# Patient Record
Sex: Female | Born: 1960
Health system: Southern US, Community
[De-identification: ages and names within clinical notes are randomized; demographics above are authoritative.]

---

## 1997-05-19 ENCOUNTER — Other Ambulatory Visit: Admission: RE | Admit: 1997-05-19 | Discharge: 1997-05-19 | Payer: Self-pay | Admitting: Obstetrics and Gynecology

## 1999-10-15 ENCOUNTER — Encounter: Payer: Self-pay | Admitting: Obstetrics and Gynecology

## 1999-10-15 ENCOUNTER — Encounter: Admission: RE | Admit: 1999-10-15 | Discharge: 1999-10-15 | Payer: Self-pay | Admitting: Obstetrics and Gynecology

## 1999-10-15 ENCOUNTER — Other Ambulatory Visit: Admission: RE | Admit: 1999-10-15 | Discharge: 1999-10-15 | Payer: Self-pay | Admitting: Obstetrics and Gynecology

## 2000-10-26 ENCOUNTER — Other Ambulatory Visit: Admission: RE | Admit: 2000-10-26 | Discharge: 2000-10-26 | Payer: Self-pay | Admitting: Obstetrics and Gynecology

## 2001-07-26 ENCOUNTER — Encounter: Admission: RE | Admit: 2001-07-26 | Discharge: 2001-07-26 | Payer: Self-pay | Admitting: Obstetrics and Gynecology

## 2001-07-26 ENCOUNTER — Encounter: Payer: Self-pay | Admitting: Obstetrics and Gynecology

## 2004-05-13 ENCOUNTER — Encounter: Admission: RE | Admit: 2004-05-13 | Discharge: 2004-05-13 | Payer: Self-pay | Admitting: Obstetrics and Gynecology

## 2006-02-16 ENCOUNTER — Encounter: Admission: RE | Admit: 2006-02-16 | Discharge: 2006-02-16 | Payer: Self-pay | Admitting: Obstetrics and Gynecology

## 2007-04-26 ENCOUNTER — Encounter: Admission: RE | Admit: 2007-04-26 | Discharge: 2007-04-26 | Payer: Self-pay | Admitting: Obstetrics and Gynecology

## 2008-07-07 ENCOUNTER — Encounter: Admission: RE | Admit: 2008-07-07 | Discharge: 2008-07-07 | Payer: Self-pay | Admitting: Obstetrics and Gynecology

## 2009-08-03 ENCOUNTER — Encounter: Admission: RE | Admit: 2009-08-03 | Discharge: 2009-08-03 | Payer: Self-pay | Admitting: Obstetrics and Gynecology

## 2010-08-21 ENCOUNTER — Other Ambulatory Visit: Payer: Self-pay | Admitting: Obstetrics and Gynecology

## 2010-08-21 DIAGNOSIS — Z1231 Encounter for screening mammogram for malignant neoplasm of breast: Secondary | ICD-10-CM

## 2010-09-30 ENCOUNTER — Ambulatory Visit
Admission: RE | Admit: 2010-09-30 | Discharge: 2010-09-30 | Disposition: A | Discharge status: Home / Self Care | Payer: BC Managed Care – PPO | Source: Ambulatory Visit | Attending: Obstetrics and Gynecology | Admitting: Obstetrics and Gynecology

## 2010-09-30 DIAGNOSIS — Z1231 Encounter for screening mammogram for malignant neoplasm of breast: Secondary | ICD-10-CM

## 2011-09-30 ENCOUNTER — Other Ambulatory Visit: Payer: Self-pay | Admitting: Obstetrics and Gynecology

## 2011-09-30 DIAGNOSIS — Z1231 Encounter for screening mammogram for malignant neoplasm of breast: Secondary | ICD-10-CM

## 2011-10-17 ENCOUNTER — Ambulatory Visit
Admission: RE | Admit: 2011-10-17 | Discharge: 2011-10-17 | Disposition: A | Discharge status: Home / Self Care | Payer: BC Managed Care – PPO | Source: Ambulatory Visit | Attending: Obstetrics and Gynecology | Admitting: Obstetrics and Gynecology

## 2011-10-17 DIAGNOSIS — Z1231 Encounter for screening mammogram for malignant neoplasm of breast: Secondary | ICD-10-CM

## 2012-12-07 ENCOUNTER — Other Ambulatory Visit: Payer: Self-pay

## 2012-12-07 DIAGNOSIS — Z1231 Encounter for screening mammogram for malignant neoplasm of breast: Secondary | ICD-10-CM

## 2013-01-10 ENCOUNTER — Ambulatory Visit
Admission: RE | Admit: 2013-01-10 | Discharge: 2013-01-10 | Disposition: A | Discharge status: Home / Self Care | Payer: BC Managed Care – PPO | Source: Ambulatory Visit

## 2013-01-10 DIAGNOSIS — Z1231 Encounter for screening mammogram for malignant neoplasm of breast: Secondary | ICD-10-CM

## 2015-04-07 DIAGNOSIS — E119 Type 2 diabetes mellitus without complications: Secondary | ICD-10-CM | POA: Diagnosis not present

## 2015-04-23 DIAGNOSIS — R740 Nonspecific elevation of levels of transaminase and lactic acid dehydrogenase [LDH]: Secondary | ICD-10-CM | POA: Diagnosis not present

## 2015-05-07 DIAGNOSIS — E119 Type 2 diabetes mellitus without complications: Secondary | ICD-10-CM | POA: Diagnosis not present

## 2015-05-08 DIAGNOSIS — L309 Dermatitis, unspecified: Secondary | ICD-10-CM | POA: Diagnosis not present

## 2015-05-08 DIAGNOSIS — E663 Overweight: Secondary | ICD-10-CM | POA: Diagnosis not present

## 2015-05-08 DIAGNOSIS — Z6828 Body mass index (BMI) 28.0-28.9, adult: Secondary | ICD-10-CM | POA: Diagnosis not present

## 2015-05-08 DIAGNOSIS — M317 Microscopic polyangiitis: Secondary | ICD-10-CM | POA: Diagnosis not present

## 2015-05-18 DIAGNOSIS — H5211 Myopia, right eye: Secondary | ICD-10-CM | POA: Diagnosis not present

## 2015-05-22 DIAGNOSIS — M310 Hypersensitivity angiitis: Secondary | ICD-10-CM | POA: Diagnosis not present

## 2015-06-11 DIAGNOSIS — M310 Hypersensitivity angiitis: Secondary | ICD-10-CM | POA: Diagnosis not present

## 2015-06-11 DIAGNOSIS — M7580 Other shoulder lesions, unspecified shoulder: Secondary | ICD-10-CM | POA: Diagnosis not present

## 2015-06-11 DIAGNOSIS — E1165 Type 2 diabetes mellitus with hyperglycemia: Secondary | ICD-10-CM | POA: Diagnosis not present

## 2015-06-11 DIAGNOSIS — R7989 Other specified abnormal findings of blood chemistry: Secondary | ICD-10-CM | POA: Diagnosis not present

## 2015-07-09 DIAGNOSIS — E1165 Type 2 diabetes mellitus with hyperglycemia: Secondary | ICD-10-CM | POA: Diagnosis not present

## 2015-07-09 DIAGNOSIS — R7989 Other specified abnormal findings of blood chemistry: Secondary | ICD-10-CM | POA: Diagnosis not present

## 2015-08-14 DIAGNOSIS — R7989 Other specified abnormal findings of blood chemistry: Secondary | ICD-10-CM | POA: Diagnosis not present

## 2015-08-14 DIAGNOSIS — R945 Abnormal results of liver function studies: Secondary | ICD-10-CM | POA: Diagnosis not present

## 2015-09-13 DIAGNOSIS — Z6828 Body mass index (BMI) 28.0-28.9, adult: Secondary | ICD-10-CM | POA: Diagnosis not present

## 2015-09-13 DIAGNOSIS — K760 Fatty (change of) liver, not elsewhere classified: Secondary | ICD-10-CM | POA: Diagnosis not present

## 2015-09-13 DIAGNOSIS — R040 Epistaxis: Secondary | ICD-10-CM | POA: Diagnosis not present

## 2015-09-17 DIAGNOSIS — R040 Epistaxis: Secondary | ICD-10-CM | POA: Diagnosis not present

## 2015-09-18 DIAGNOSIS — R740 Nonspecific elevation of levels of transaminase and lactic acid dehydrogenase [LDH]: Secondary | ICD-10-CM | POA: Diagnosis not present

## 2015-09-18 DIAGNOSIS — K760 Fatty (change of) liver, not elsewhere classified: Secondary | ICD-10-CM | POA: Diagnosis not present

## 2015-09-20 ENCOUNTER — Other Ambulatory Visit (HOSPITAL_COMMUNITY): Payer: Self-pay | Admitting: Gastroenterology

## 2015-09-20 DIAGNOSIS — R748 Abnormal levels of other serum enzymes: Secondary | ICD-10-CM

## 2015-09-20 DIAGNOSIS — K760 Fatty (change of) liver, not elsewhere classified: Secondary | ICD-10-CM

## 2015-10-02 DIAGNOSIS — R040 Epistaxis: Secondary | ICD-10-CM | POA: Diagnosis not present

## 2015-10-03 ENCOUNTER — Other Ambulatory Visit: Payer: Self-pay | Admitting: Radiology

## 2015-10-04 ENCOUNTER — Other Ambulatory Visit: Payer: Self-pay | Admitting: General Surgery

## 2015-10-05 ENCOUNTER — Ambulatory Visit (HOSPITAL_COMMUNITY)
Admission: RE | Admit: 2015-10-05 | Discharge: 2015-10-05 | Disposition: A | Discharge status: Home / Self Care | Payer: BLUE CROSS/BLUE SHIELD | Source: Ambulatory Visit | Attending: Gastroenterology | Admitting: Gastroenterology

## 2015-10-05 ENCOUNTER — Encounter (HOSPITAL_COMMUNITY): Payer: Self-pay | Admitting: *Deleted

## 2015-10-05 DIAGNOSIS — R7989 Other specified abnormal findings of blood chemistry: Secondary | ICD-10-CM | POA: Diagnosis not present

## 2015-10-05 DIAGNOSIS — R748 Abnormal levels of other serum enzymes: Secondary | ICD-10-CM | POA: Insufficient documentation

## 2015-10-05 DIAGNOSIS — R945 Abnormal results of liver function studies: Secondary | ICD-10-CM | POA: Diagnosis not present

## 2015-10-05 DIAGNOSIS — Z79899 Other long term (current) drug therapy: Secondary | ICD-10-CM | POA: Diagnosis not present

## 2015-10-05 DIAGNOSIS — K7581 Nonalcoholic steatohepatitis (NASH): Secondary | ICD-10-CM | POA: Diagnosis not present

## 2015-10-05 DIAGNOSIS — Z7984 Long term (current) use of oral hypoglycemic drugs: Secondary | ICD-10-CM | POA: Diagnosis not present

## 2015-10-05 LAB — GLUCOSE, CAPILLARY: Glucose-Capillary: 107 mg/dL — ABNORMAL HIGH (ref 65–99)

## 2015-10-05 LAB — CBC
HEMATOCRIT: 41 % (ref 36.0–46.0)
HEMOGLOBIN: 13 g/dL (ref 12.0–15.0)
MCH: 29.3 pg (ref 26.0–34.0)
MCHC: 31.7 g/dL (ref 30.0–36.0)
MCV: 92.6 fL (ref 78.0–100.0)
Platelets: 184 10*3/uL (ref 150–400)
RBC: 4.43 MIL/uL (ref 3.87–5.11)
RDW: 12.9 % (ref 11.5–15.5)
WBC: 6.5 10*3/uL (ref 4.0–10.5)

## 2015-10-05 LAB — PROTIME-INR
INR: 1.01
Prothrombin Time: 13.3 seconds (ref 11.4–15.2)

## 2015-10-05 LAB — APTT: aPTT: 34 seconds (ref 24–36)

## 2015-10-05 MED ORDER — GELATIN ABSORBABLE 12-7 MM EX MISC
CUTANEOUS | Status: AC
  Filled 2015-10-05: qty 1

## 2015-10-05 MED ORDER — LIDOCAINE HCL 1 % IJ SOLN
INTRAMUSCULAR | Status: AC
  Filled 2015-10-05: qty 20

## 2015-10-05 MED ORDER — MIDAZOLAM HCL 2 MG/2ML IJ SOLN
INTRAMUSCULAR | Status: DC | PRN
  Administered 2015-10-05: 1 mg via INTRAVENOUS
  Administered 2015-10-05 (×2): 0.5 mg via INTRAVENOUS

## 2015-10-05 MED ORDER — SODIUM CHLORIDE 0.9 % IV SOLN
INTRAVENOUS | Status: DC | PRN
  Administered 2015-10-05: 30 mL/h via INTRAVENOUS

## 2015-10-05 MED ORDER — FENTANYL CITRATE (PF) 100 MCG/2ML IJ SOLN
INTRAMUSCULAR | Status: AC
  Filled 2015-10-05: qty 4

## 2015-10-05 MED ORDER — SODIUM CHLORIDE 0.9 % IV SOLN: INTRAVENOUS | Status: DC

## 2015-10-05 MED ORDER — MIDAZOLAM HCL 2 MG/2ML IJ SOLN
INTRAMUSCULAR | Status: AC
  Filled 2015-10-05: qty 4

## 2015-10-05 MED ORDER — FENTANYL CITRATE (PF) 100 MCG/2ML IJ SOLN
INTRAMUSCULAR | Status: DC | PRN
  Administered 2015-10-05: 25 ug via INTRAVENOUS
  Administered 2015-10-05: 50 ug via INTRAVENOUS
  Administered 2015-10-05: 25 ug via INTRAVENOUS

## 2015-10-05 MED ORDER — HYDROCODONE-ACETAMINOPHEN 5-325 MG PO TABS: 1.0000 | ORAL_TABLET | ORAL | Status: DC | PRN

## 2015-10-05 NOTE — Discharge Instructions (Signed)
Liver Biopsy, Care After These instructions give you information on caring for yourself after your procedure. Your doctor may also give you more specific instructions. Call your doctor if you have any problems or questions after your procedure. HOME CARE  Rest at home for 1-2 days or as told by your doctor.  Have someone stay with you for at least 24 hours.  Do not do these things in the first 24 hours:  Drive.  Use machinery.  Take care of other people.  Sign legal documents.  Take a bath or shower.  There are many different ways to close and cover a cut (incision). For example, a cut can be closed with stitches, skin glue, or adhesive strips. Follow your doctor's instructions on:  Taking care of your cut.  Changing and removing your bandage (dressing).  Removing whatever was used to close your cut.  Do not drink alcohol in the first week.  Do not lift more than 5 pounds or play contact sports for the first 2 weeks.  Take medicines only as told by your doctor. For 1 week, do not take medicine that has aspirin in it or medicines like ibuprofen.  Get your test results. GET HELP IF:  A cut bleeds and leaves more than just a small spot of blood.  A cut is red, puffs up (swells), or hurts more than before.  Fluid or something else comes from a cut.  A cut smells bad.  You have a fever or chills. GET HELP RIGHT AWAY IF:  You have swelling, bloating, or pain in your belly (abdomen).  You get dizzy or faint.  You have a rash.  You feel sick to your stomach (nauseous) or throw up (vomit).  You have trouble breathing, feel short of breath, or feel faint.  Your chest hurts.  You have problems talking or seeing.  You have trouble balancing or moving your arms or legs.   This information is not intended to replace advice given to you by your health care provider. Make sure you discuss any questions you have with your health care provider.   Document Released:  10/02/2007 Document Revised: 01/13/2014 Document Reviewed: 02/18/2013 Elsevier Interactive Patient Education 2016 [employer]  May return to work Tuesday 10/10/15 but will have a 5 lb lift restriction, return to normal activities 10/11/15

## 2015-10-05 NOTE — Sedation Documentation (Signed)
Moved to [place]

## 2015-10-05 NOTE — Sedation Documentation (Signed)
MD at bedside.  Dr Gillis SantaMcCulllough in procedure explained, questions answered.

## 2015-10-05 NOTE — H&P (Signed)
Chief Complaint: Patient was seen in consultation today for liver core biopsy at the request of Schooler,Vincent  Referring Physician(s): Schooler,Vincent  Supervising Physician: Malachy Moan  Patient Status: Outpatient  History of Present Illness: Linda Burke is a 55 y.o. female   Worsening fatigue Diagnosed with fatty liver months ago Self referred to Dr Bosie Clos with whom she had routine colonoscopy performed Noted elevated Ferritin and rising Noted elevated LFTs and continues Hemachromatosis gene negative per notes Request now for liver core biopsy per Dr Bosie Clos  No past medical history on file.  No past surgical history on file.  Allergies: Adhesive [tape]; Glipizide; and Prednisone  Medications: Prior to Admission medications   Medication Sig Start Date End Date Taking? Authorizing Provider  metFORMIN (GLUCOPHAGE-XR) 500 MG 24 hr tablet Take 2,000 mg by mouth daily with breakfast.   Yes Historical Provider, MD  sitaGLIPtin (JANUVIA) 100 MG tablet Take 100 mg by mouth daily.   Yes Historical Provider, MD     No family history on file.  Social History   Social History  . Marital status: Married    Spouse name: N/A  . Number of children: N/A  . Years of education: N/A   Social History Main Topics  . Smoking status: Not on file  . Smokeless tobacco: Not on file  . Alcohol use Not on file  . Drug use: Unknown  . Sexual activity: Not on file   Other Topics Concern  . Not on file   Social History Narrative  . No narrative on file     Review of Systems: A 12 point ROS discussed and pertinent positives are indicated in the HPI above.  All other systems are negative.  Review of Systems  Constitutional: Positive for fatigue. Negative for activity change, appetite change, fever and unexpected weight change.  Respiratory: Negative for shortness of breath.   Gastrointestinal: Negative for abdominal pain.  Neurological: Negative for  weakness.  Psychiatric/Behavioral: Negative for behavioral problems and confusion.    Vital Signs: BP 134/76   Pulse 67   Temp 97.6 F (36.4 C)   Resp 16   Ht 5' 4.5" (1.638 m)   Wt 155 lb (70.3 kg)   LMP 07/14/1997 (Approximate)   SpO2 98%   BMI 26.19 kg/m   Physical Exam  Constitutional: She is oriented to person, place, and time. She appears well-nourished.  Cardiovascular: Normal rate, regular rhythm and normal heart sounds.   Pulmonary/Chest: Effort normal and breath sounds normal.  Abdominal: Soft. Bowel sounds are normal.  Musculoskeletal: Normal range of motion.  Neurological: She is alert and oriented to person, place, and time.  Skin: Skin is warm and dry.  Psychiatric: She has a normal mood and affect. Her behavior is normal. Judgment and thought content normal.  Nursing note and vitals reviewed.   Mallampati Score:  MD Evaluation Airway: WNL Heart: WNL Abdomen: WNL Chest/ Lungs: WNL ASA  Classification: 2 Mallampati/Airway Score: One  Imaging: No results found.  Labs:  CBC:  Recent Labs  10/05/15 1209  WBC 6.5  HGB 13.0  HCT 41.0  PLT 184    COAGS:  Recent Labs  10/05/15 1209  INR 1.01  APTT 34    BMP: No results for input(s): NA, K, CL, CO2, GLUCOSE, BUN, CALCIUM, CREATININE, GFRNONAA, GFRAA in the last 8760 hours.  Invalid input(s): CMP  LIVER FUNCTION TESTS: No results for input(s): BILITOT, AST, ALT, ALKPHOS, PROT, ALBUMIN in the last 8760 hours.  TUMOR MARKERS:  No results for input(s): AFPTM, CEA, CA199, CHROMGRNA in the last 8760 hours.  Assessment and Plan:  Elevated Ferritin Elevated liver functions Scheduled for liver core biopsy Risks and Benefits discussed with the patient including, but not limited to bleeding, infection, damage to adjacent structures or low yield requiring additional tests. All of the patient's questions were answered, patient is agreeable to proceed. Consent signed and in chart.   Thank you  for this interesting consult.  I greatly enjoyed meeting Malena PeerBenita K Harnish and look forward to participating in their care.  A copy of this report was sent to the requesting provider on this date.  Electronically Signed: Ralene MuskratURPIN,Dashanna Kinnamon A 10/05/2015, 12:49 PM   I spent a total of  30 Minutes   in face to face in clinical consultation, greater than 50% of which was counseling/coordinating care for liver core biopsy

## 2015-10-05 NOTE — Sedation Documentation (Signed)
Awaiting room readiness and MD.  Events and procedure explained to pt and husband, verbalize understanding.  Call bell in reach.

## 2015-10-05 NOTE — Sedation Documentation (Signed)
Patient is resting comfortably. 

## 2015-10-05 NOTE — Procedures (Signed)
Interventional Radiology Procedure Note  Procedure: US guided liver bx  Complications: None  Estimated Blood Loss: 0  Recommendations: - DC home after bedrest  Signed,  Sterling BigHeath K. Dougles Kimmey, MD

## 2015-10-05 NOTE — Sedation Documentation (Signed)
o2 d/c'd 

## 2015-10-12 DIAGNOSIS — E1165 Type 2 diabetes mellitus with hyperglycemia: Secondary | ICD-10-CM | POA: Diagnosis not present

## 2015-10-12 DIAGNOSIS — Z1389 Encounter for screening for other disorder: Secondary | ICD-10-CM | POA: Diagnosis not present

## 2015-10-12 DIAGNOSIS — Z6827 Body mass index (BMI) 27.0-27.9, adult: Secondary | ICD-10-CM | POA: Diagnosis not present

## 2015-10-29 DIAGNOSIS — R740 Nonspecific elevation of levels of transaminase and lactic acid dehydrogenase [LDH]: Secondary | ICD-10-CM | POA: Diagnosis not present

## 2015-12-07 DIAGNOSIS — R748 Abnormal levels of other serum enzymes: Secondary | ICD-10-CM | POA: Diagnosis not present

## 2015-12-07 DIAGNOSIS — K7581 Nonalcoholic steatohepatitis (NASH): Secondary | ICD-10-CM | POA: Diagnosis not present

## 2015-12-07 DIAGNOSIS — K7469 Other cirrhosis of liver: Secondary | ICD-10-CM | POA: Diagnosis not present

## 2016-02-12 ENCOUNTER — Other Ambulatory Visit: Payer: Self-pay | Admitting: Gastroenterology

## 2016-02-12 DIAGNOSIS — K7469 Other cirrhosis of liver: Secondary | ICD-10-CM

## 2016-02-15 DIAGNOSIS — K7460 Unspecified cirrhosis of liver: Secondary | ICD-10-CM | POA: Diagnosis not present

## 2016-02-15 DIAGNOSIS — M7580 Other shoulder lesions, unspecified shoulder: Secondary | ICD-10-CM | POA: Diagnosis not present

## 2016-02-15 DIAGNOSIS — K7581 Nonalcoholic steatohepatitis (NASH): Secondary | ICD-10-CM | POA: Diagnosis not present

## 2016-02-15 DIAGNOSIS — E1165 Type 2 diabetes mellitus with hyperglycemia: Secondary | ICD-10-CM | POA: Diagnosis not present

## 2016-02-18 ENCOUNTER — Ambulatory Visit
Admission: RE | Admit: 2016-02-18 | Discharge: 2016-02-18 | Disposition: A | Discharge status: Home / Self Care | Payer: BLUE CROSS/BLUE SHIELD | Source: Ambulatory Visit | Attending: Gastroenterology | Admitting: Gastroenterology

## 2016-02-18 DIAGNOSIS — K7469 Other cirrhosis of liver: Secondary | ICD-10-CM | POA: Diagnosis not present

## 2016-02-18 DIAGNOSIS — K7460 Unspecified cirrhosis of liver: Secondary | ICD-10-CM | POA: Diagnosis not present

## 2016-03-24 DIAGNOSIS — Z6827 Body mass index (BMI) 27.0-27.9, adult: Secondary | ICD-10-CM | POA: Diagnosis not present

## 2016-03-24 DIAGNOSIS — E1165 Type 2 diabetes mellitus with hyperglycemia: Secondary | ICD-10-CM | POA: Diagnosis not present

## 2016-04-08 DIAGNOSIS — Z01419 Encounter for gynecological examination (general) (routine) without abnormal findings: Secondary | ICD-10-CM | POA: Diagnosis not present

## 2016-04-08 DIAGNOSIS — R300 Dysuria: Secondary | ICD-10-CM | POA: Diagnosis not present

## 2016-04-08 DIAGNOSIS — Z6827 Body mass index (BMI) 27.0-27.9, adult: Secondary | ICD-10-CM | POA: Diagnosis not present

## 2016-04-08 DIAGNOSIS — Z1231 Encounter for screening mammogram for malignant neoplasm of breast: Secondary | ICD-10-CM | POA: Diagnosis not present

## 2016-04-08 DIAGNOSIS — R350 Frequency of micturition: Secondary | ICD-10-CM | POA: Diagnosis not present

## 2016-05-22 DIAGNOSIS — D179 Benign lipomatous neoplasm, unspecified: Secondary | ICD-10-CM | POA: Diagnosis not present

## 2016-05-30 DIAGNOSIS — E663 Overweight: Secondary | ICD-10-CM | POA: Diagnosis not present

## 2016-05-30 DIAGNOSIS — Z6828 Body mass index (BMI) 28.0-28.9, adult: Secondary | ICD-10-CM | POA: Diagnosis not present

## 2016-05-30 DIAGNOSIS — R040 Epistaxis: Secondary | ICD-10-CM | POA: Diagnosis not present

## 2016-06-03 DIAGNOSIS — J3489 Other specified disorders of nose and nasal sinuses: Secondary | ICD-10-CM | POA: Diagnosis not present

## 2016-06-03 DIAGNOSIS — R040 Epistaxis: Secondary | ICD-10-CM | POA: Diagnosis not present

## 2016-06-03 DIAGNOSIS — I10 Essential (primary) hypertension: Secondary | ICD-10-CM | POA: Diagnosis not present

## 2016-06-17 DIAGNOSIS — R040 Epistaxis: Secondary | ICD-10-CM | POA: Diagnosis not present

## 2016-06-17 DIAGNOSIS — J039 Acute tonsillitis, unspecified: Secondary | ICD-10-CM | POA: Diagnosis not present

## 2016-06-27 DIAGNOSIS — E1165 Type 2 diabetes mellitus with hyperglycemia: Secondary | ICD-10-CM | POA: Diagnosis not present

## 2016-06-27 DIAGNOSIS — K7581 Nonalcoholic steatohepatitis (NASH): Secondary | ICD-10-CM | POA: Diagnosis not present

## 2016-06-27 DIAGNOSIS — M7551 Bursitis of right shoulder: Secondary | ICD-10-CM | POA: Diagnosis not present

## 2016-06-27 DIAGNOSIS — Z6828 Body mass index (BMI) 28.0-28.9, adult: Secondary | ICD-10-CM | POA: Diagnosis not present

## 2016-07-02 DIAGNOSIS — J351 Hypertrophy of tonsils: Secondary | ICD-10-CM | POA: Diagnosis not present

## 2016-07-02 DIAGNOSIS — R040 Epistaxis: Secondary | ICD-10-CM | POA: Diagnosis not present

## 2016-07-02 DIAGNOSIS — R220 Localized swelling, mass and lump, head: Secondary | ICD-10-CM | POA: Diagnosis not present

## 2016-07-08 DIAGNOSIS — R220 Localized swelling, mass and lump, head: Secondary | ICD-10-CM | POA: Diagnosis not present

## 2016-07-08 DIAGNOSIS — E042 Nontoxic multinodular goiter: Secondary | ICD-10-CM | POA: Diagnosis not present

## 2016-07-15 DIAGNOSIS — J358 Other chronic diseases of tonsils and adenoids: Secondary | ICD-10-CM | POA: Diagnosis not present

## 2016-07-15 DIAGNOSIS — J439 Emphysema, unspecified: Secondary | ICD-10-CM | POA: Diagnosis not present

## 2016-07-15 DIAGNOSIS — E041 Nontoxic single thyroid nodule: Secondary | ICD-10-CM | POA: Diagnosis not present

## 2016-07-16 DIAGNOSIS — E041 Nontoxic single thyroid nodule: Secondary | ICD-10-CM | POA: Diagnosis not present

## 2016-07-25 DIAGNOSIS — E041 Nontoxic single thyroid nodule: Secondary | ICD-10-CM | POA: Diagnosis not present

## 2016-08-05 DIAGNOSIS — E041 Nontoxic single thyroid nodule: Secondary | ICD-10-CM | POA: Diagnosis not present

## 2016-08-08 ENCOUNTER — Other Ambulatory Visit: Payer: Self-pay | Admitting: Gastroenterology

## 2016-08-08 DIAGNOSIS — K7469 Other cirrhosis of liver: Secondary | ICD-10-CM

## 2016-08-08 DIAGNOSIS — Z86010 Personal history of colonic polyps: Secondary | ICD-10-CM | POA: Diagnosis not present

## 2016-08-13 DIAGNOSIS — E041 Nontoxic single thyroid nodule: Secondary | ICD-10-CM | POA: Diagnosis not present

## 2016-08-13 DIAGNOSIS — J358 Other chronic diseases of tonsils and adenoids: Secondary | ICD-10-CM | POA: Diagnosis not present

## 2016-08-22 ENCOUNTER — Ambulatory Visit
Admission: RE | Admit: 2016-08-22 | Discharge: 2016-08-22 | Disposition: A | Discharge status: Home / Self Care | Payer: BLUE CROSS/BLUE SHIELD | Source: Ambulatory Visit | Attending: Gastroenterology | Admitting: Gastroenterology

## 2016-08-22 DIAGNOSIS — K7469 Other cirrhosis of liver: Secondary | ICD-10-CM

## 2016-08-22 DIAGNOSIS — K760 Fatty (change of) liver, not elsewhere classified: Secondary | ICD-10-CM | POA: Diagnosis not present

## 2016-09-02 ENCOUNTER — Other Ambulatory Visit: Payer: Self-pay | Admitting: Gastroenterology

## 2016-09-02 DIAGNOSIS — K7469 Other cirrhosis of liver: Secondary | ICD-10-CM

## 2016-09-12 ENCOUNTER — Ambulatory Visit
Admission: RE | Admit: 2016-09-12 | Discharge: 2016-09-12 | Disposition: A | Discharge status: Home / Self Care | Payer: BLUE CROSS/BLUE SHIELD | Source: Ambulatory Visit | Attending: Gastroenterology | Admitting: Gastroenterology

## 2016-09-12 DIAGNOSIS — K7460 Unspecified cirrhosis of liver: Secondary | ICD-10-CM | POA: Diagnosis not present

## 2016-09-12 DIAGNOSIS — K7469 Other cirrhosis of liver: Secondary | ICD-10-CM

## 2016-09-12 MED ORDER — GADOBENATE DIMEGLUMINE 529 MG/ML IV SOLN
15.0000 mL | Freq: Once | INTRAVENOUS | Status: AC | PRN
  Administered 2016-09-12: 15 mL via INTRAVENOUS

## 2016-09-30 DIAGNOSIS — K7460 Unspecified cirrhosis of liver: Secondary | ICD-10-CM | POA: Diagnosis not present

## 2016-09-30 DIAGNOSIS — E042 Nontoxic multinodular goiter: Secondary | ICD-10-CM | POA: Diagnosis not present

## 2016-09-30 DIAGNOSIS — E1165 Type 2 diabetes mellitus with hyperglycemia: Secondary | ICD-10-CM | POA: Diagnosis not present

## 2016-09-30 DIAGNOSIS — J438 Other emphysema: Secondary | ICD-10-CM | POA: Diagnosis not present

## 2017-01-01 DIAGNOSIS — K640 First degree hemorrhoids: Secondary | ICD-10-CM | POA: Diagnosis not present

## 2017-01-01 DIAGNOSIS — Z86010 Personal history of colonic polyps: Secondary | ICD-10-CM | POA: Diagnosis not present

## 2017-01-01 DIAGNOSIS — D126 Benign neoplasm of colon, unspecified: Secondary | ICD-10-CM | POA: Diagnosis not present

## 2017-01-07 DIAGNOSIS — R413 Other amnesia: Secondary | ICD-10-CM | POA: Diagnosis not present

## 2017-01-07 DIAGNOSIS — K7460 Unspecified cirrhosis of liver: Secondary | ICD-10-CM | POA: Diagnosis not present

## 2017-01-07 DIAGNOSIS — M7551 Bursitis of right shoulder: Secondary | ICD-10-CM | POA: Diagnosis not present

## 2017-01-07 DIAGNOSIS — E1165 Type 2 diabetes mellitus with hyperglycemia: Secondary | ICD-10-CM | POA: Diagnosis not present

## 2017-01-07 DIAGNOSIS — Z1331 Encounter for screening for depression: Secondary | ICD-10-CM | POA: Diagnosis not present

## 2017-01-08 DIAGNOSIS — D126 Benign neoplasm of colon, unspecified: Secondary | ICD-10-CM | POA: Diagnosis not present

## 2017-02-16 DIAGNOSIS — K7469 Other cirrhosis of liver: Secondary | ICD-10-CM | POA: Diagnosis not present

## 2017-03-16 ENCOUNTER — Other Ambulatory Visit: Payer: Self-pay | Admitting: Gastroenterology

## 2017-03-16 DIAGNOSIS — K7469 Other cirrhosis of liver: Secondary | ICD-10-CM

## 2017-03-20 ENCOUNTER — Ambulatory Visit
Admission: RE | Admit: 2017-03-20 | Discharge: 2017-03-20 | Disposition: A | Discharge status: Home / Self Care | Payer: BLUE CROSS/BLUE SHIELD | Source: Ambulatory Visit | Attending: Gastroenterology | Admitting: Gastroenterology

## 2017-03-20 DIAGNOSIS — K828 Other specified diseases of gallbladder: Secondary | ICD-10-CM | POA: Diagnosis not present

## 2017-03-20 DIAGNOSIS — K7469 Other cirrhosis of liver: Secondary | ICD-10-CM

## 2017-04-07 DIAGNOSIS — Z6829 Body mass index (BMI) 29.0-29.9, adult: Secondary | ICD-10-CM | POA: Diagnosis not present

## 2017-04-07 DIAGNOSIS — Z1331 Encounter for screening for depression: Secondary | ICD-10-CM | POA: Diagnosis not present

## 2017-04-07 DIAGNOSIS — E1165 Type 2 diabetes mellitus with hyperglycemia: Secondary | ICD-10-CM | POA: Diagnosis not present

## 2017-04-27 DIAGNOSIS — Z1231 Encounter for screening mammogram for malignant neoplasm of breast: Secondary | ICD-10-CM | POA: Diagnosis not present

## 2017-04-27 DIAGNOSIS — Z6829 Body mass index (BMI) 29.0-29.9, adult: Secondary | ICD-10-CM | POA: Diagnosis not present

## 2017-04-27 DIAGNOSIS — Z1151 Encounter for screening for human papillomavirus (HPV): Secondary | ICD-10-CM | POA: Diagnosis not present

## 2017-04-27 DIAGNOSIS — Z01419 Encounter for gynecological examination (general) (routine) without abnormal findings: Secondary | ICD-10-CM | POA: Diagnosis not present

## 2017-07-20 DIAGNOSIS — K7581 Nonalcoholic steatohepatitis (NASH): Secondary | ICD-10-CM | POA: Diagnosis not present

## 2017-07-20 DIAGNOSIS — E042 Nontoxic multinodular goiter: Secondary | ICD-10-CM | POA: Diagnosis not present

## 2017-07-20 DIAGNOSIS — Z6830 Body mass index (BMI) 30.0-30.9, adult: Secondary | ICD-10-CM | POA: Diagnosis not present

## 2017-07-20 DIAGNOSIS — E1165 Type 2 diabetes mellitus with hyperglycemia: Secondary | ICD-10-CM | POA: Diagnosis not present

## 2017-08-22 IMAGING — US US BIOPSY
1 series · 13 of 18 positions shown · non-contrast
Comparison: none

INDICATION: 55-year-old female with elevated liver enzymes.

[Series 1: us biopsy · 0.26mm/px · 13 of 18 slices shown]
[im 1/18]
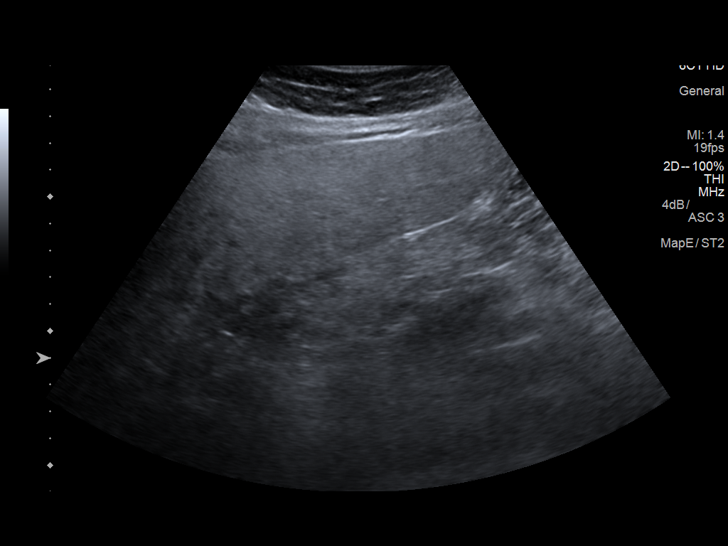
[im 3/18]
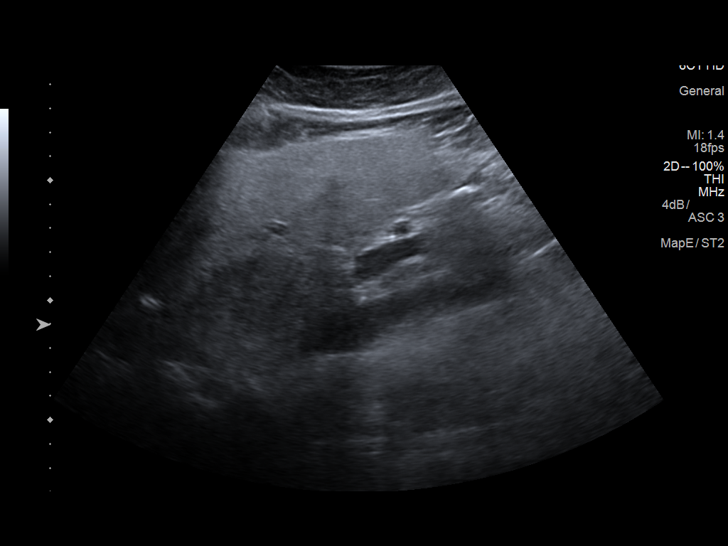
[im 4/18]
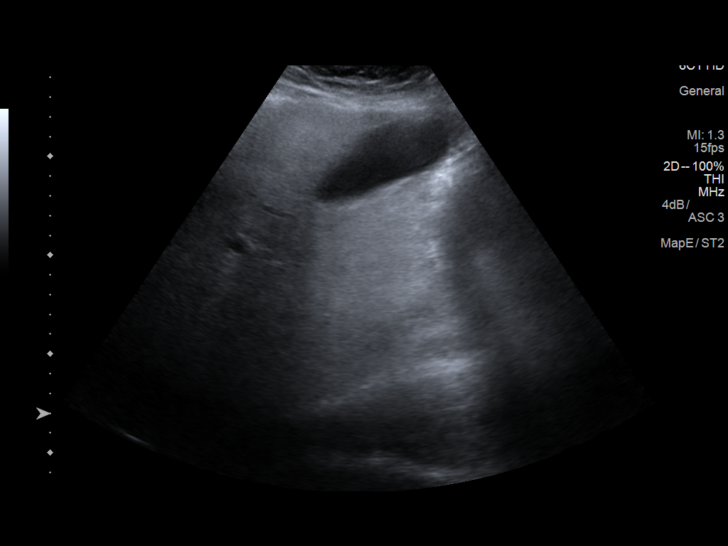
[im 5/18]
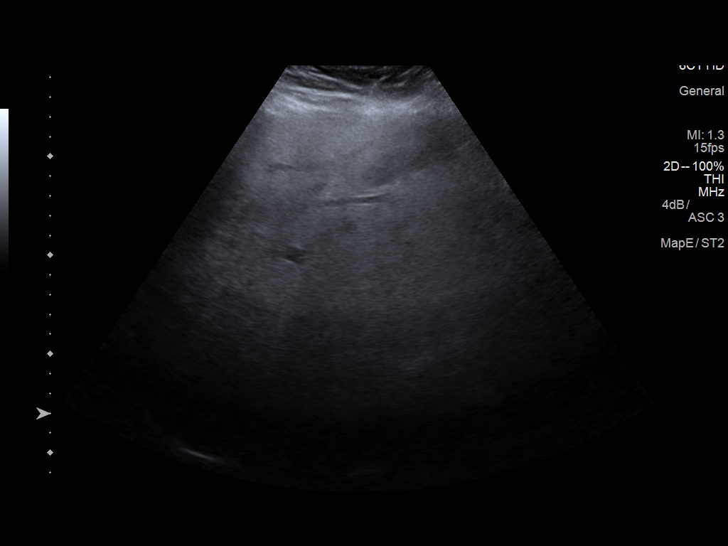
[im 7/18]
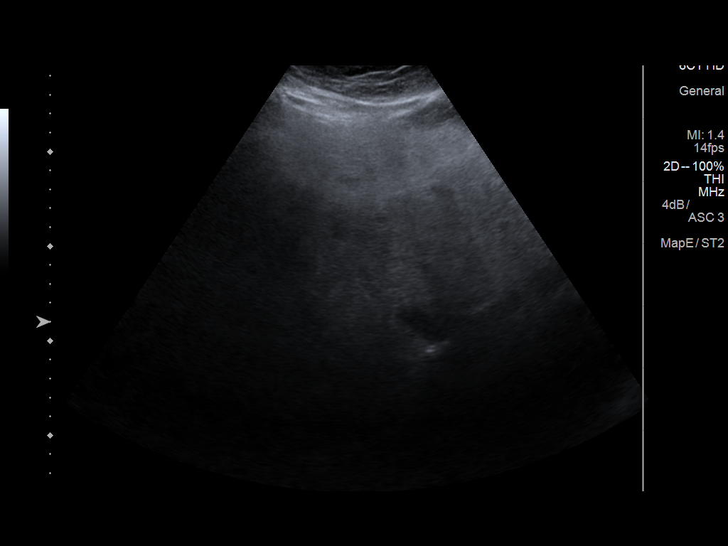
[im 8/18]
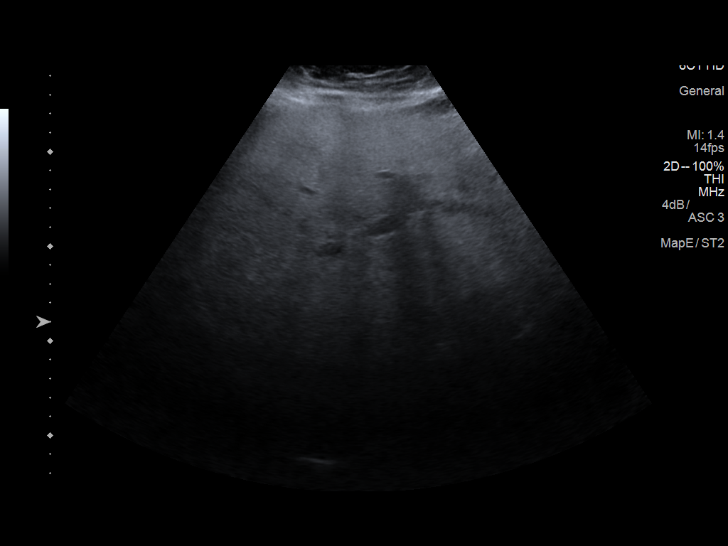
[im 10/18]
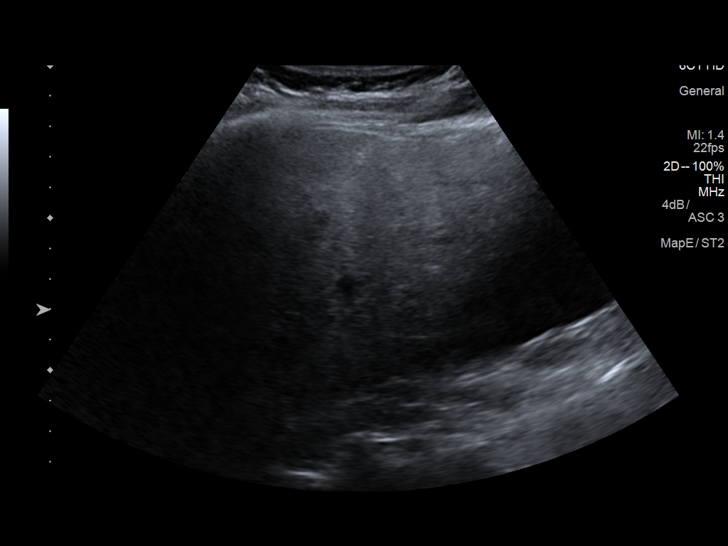
[im 11/18]
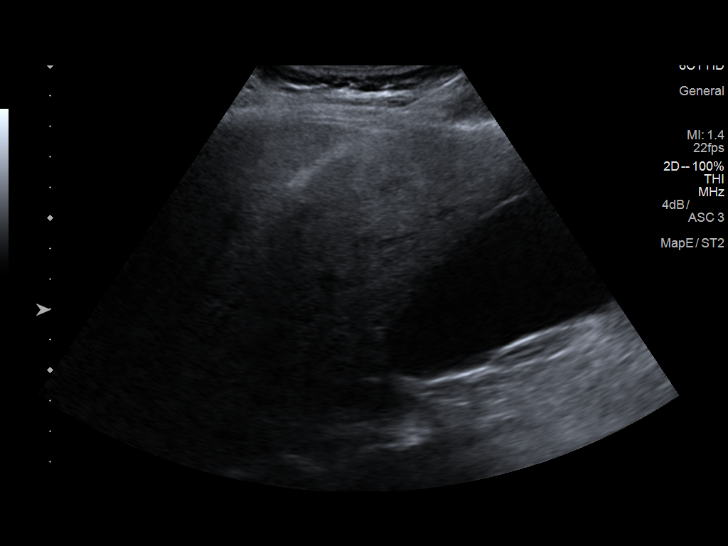
[im 12/18]
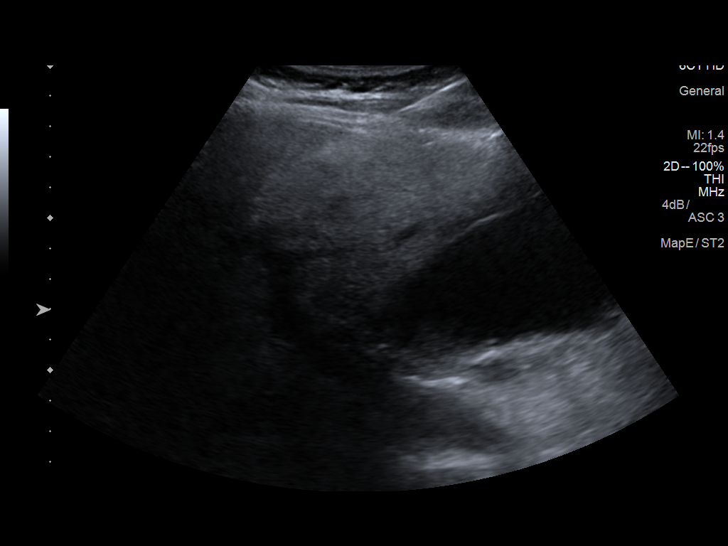
[im 14/18]
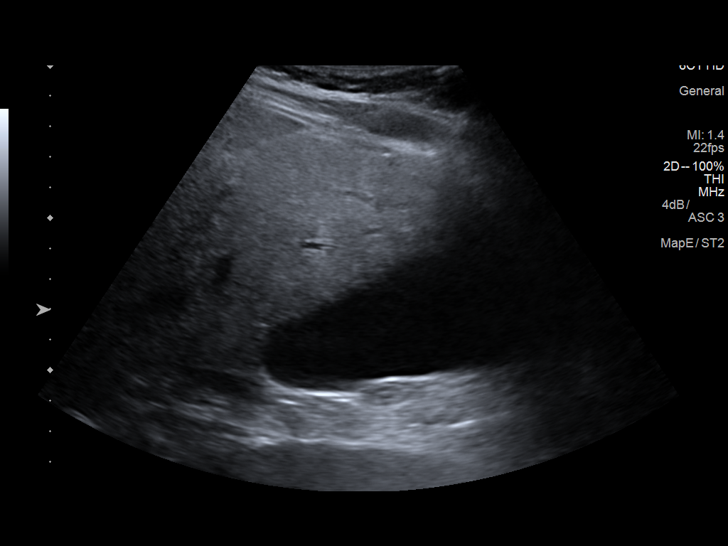
[im 15/18]
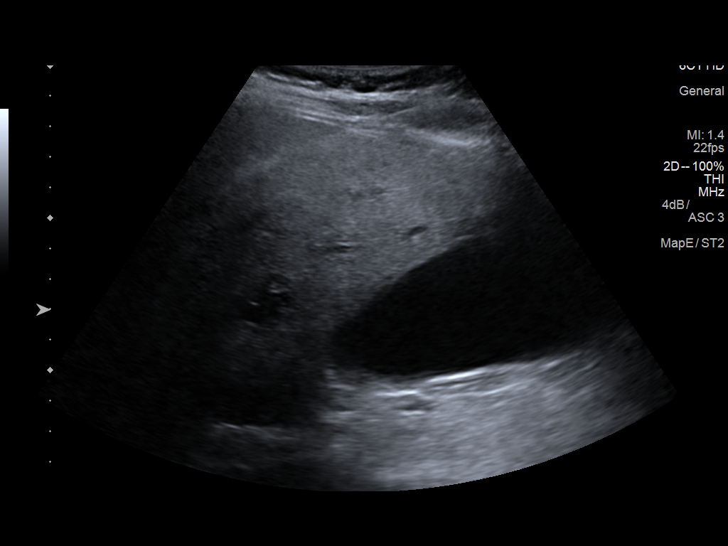
[im 16/18]
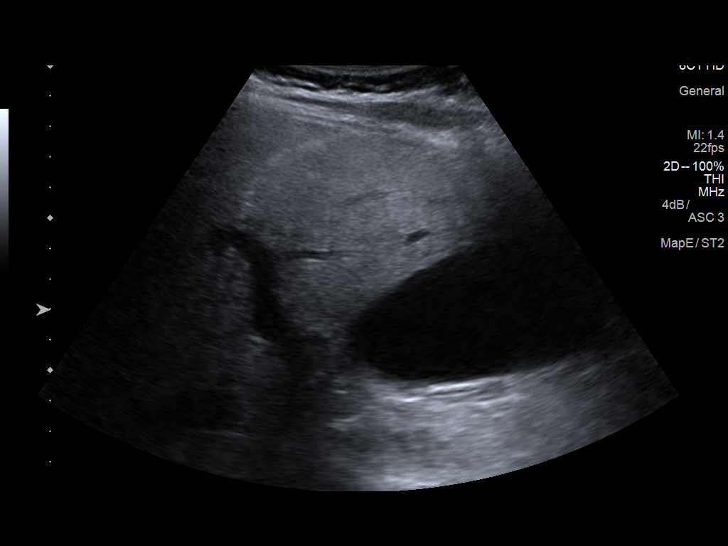
[im 18/18]
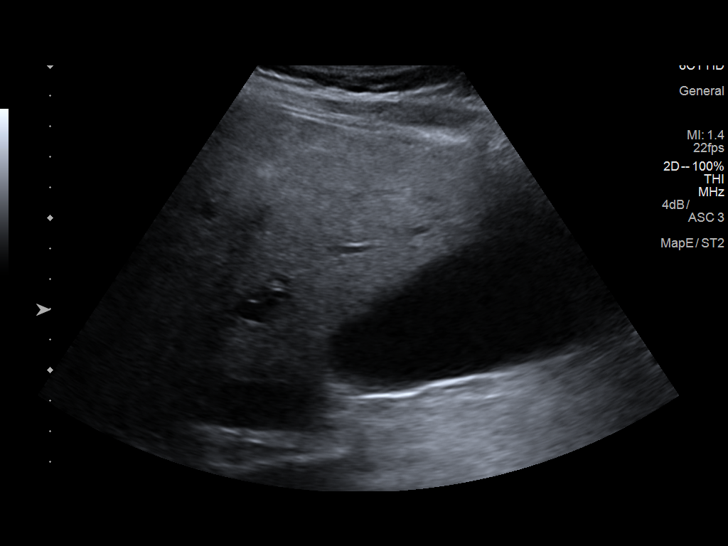

[13 of 18 positions shown; findings below may reference images not displayed]

EXAM:
Ultrasound-guided core biopsy of the liver

MEDICATIONS:
None.

ANESTHESIA/SEDATION:
Fentanyl 100 mcg IV; Versed 2 mg IV

Moderate Sedation Time:  9 minutes

The patient was continuously monitored during the procedure by the
interventional radiology nurse under my direct supervision.

FLUOROSCOPY TIME:  Fluoroscopy Time: 0 minutes 0 seconds (0 mGy).

COMPLICATIONS:
None immediate.

PROCEDURE:
Informed consent was obtained from the patient following explanation
of the procedure, risks, benefits and alternatives. The patient
understands, agrees and consents for the procedure. All questions
were addressed. A time out was performed.

The right upper quadrant was interrogated with ultrasound. A
relatively avascular plane of the liver was identified. A suitable
skin entry site was selected and marked. The region was then
sterilely prepped and draped in standard fashion using chlorhexidine
skin prep. Local anesthesia was attained by infiltration with 1%
lidocaine. A small dermatotomy was made.

Under real-time sonographic guidance, a 17 gauge trocar needle was
advanced into the liver. Multiple 18 gauge core biopsies were then
coaxially obtained. Needle placement was confirmed on all biopsy
passes with real-time sonography. Biopsy specimens were placed in
formalin and delivered to pathology for further analysis.

As the introducer needle was removed, the biopsy tract was embolized
with a Gel-Foam slurry. Post biopsy ultrasound imaging demonstrates
no active bleeding or perihepatic hematoma. The patient tolerated
the procedure well.
IMPRESSION: Technically successful ultrasound-guided random core biopsy of the
liver.

## 2017-09-04 DIAGNOSIS — L814 Other melanin hyperpigmentation: Secondary | ICD-10-CM | POA: Diagnosis not present

## 2017-09-04 DIAGNOSIS — D225 Melanocytic nevi of trunk: Secondary | ICD-10-CM | POA: Diagnosis not present

## 2017-09-04 DIAGNOSIS — L821 Other seborrheic keratosis: Secondary | ICD-10-CM | POA: Diagnosis not present

## 2017-09-04 DIAGNOSIS — L570 Actinic keratosis: Secondary | ICD-10-CM | POA: Diagnosis not present

## 2017-09-04 DIAGNOSIS — L578 Other skin changes due to chronic exposure to nonionizing radiation: Secondary | ICD-10-CM | POA: Diagnosis not present

## 2017-09-04 DIAGNOSIS — D485 Neoplasm of uncertain behavior of skin: Secondary | ICD-10-CM | POA: Diagnosis not present

## 2017-09-08 ENCOUNTER — Other Ambulatory Visit: Payer: Self-pay | Admitting: Gastroenterology

## 2017-09-08 DIAGNOSIS — K7469 Other cirrhosis of liver: Secondary | ICD-10-CM | POA: Diagnosis not present

## 2017-09-14 DIAGNOSIS — E041 Nontoxic single thyroid nodule: Secondary | ICD-10-CM | POA: Diagnosis not present

## 2017-09-18 ENCOUNTER — Ambulatory Visit
Admission: RE | Admit: 2017-09-18 | Discharge: 2017-09-18 | Disposition: A | Discharge status: Home / Self Care | Payer: BLUE CROSS/BLUE SHIELD | Source: Ambulatory Visit | Attending: Gastroenterology | Admitting: Gastroenterology

## 2017-09-18 DIAGNOSIS — K7689 Other specified diseases of liver: Secondary | ICD-10-CM | POA: Diagnosis not present

## 2017-09-18 DIAGNOSIS — J358 Other chronic diseases of tonsils and adenoids: Secondary | ICD-10-CM | POA: Diagnosis not present

## 2017-09-18 DIAGNOSIS — E042 Nontoxic multinodular goiter: Secondary | ICD-10-CM | POA: Diagnosis not present

## 2017-09-18 DIAGNOSIS — K7469 Other cirrhosis of liver: Secondary | ICD-10-CM

## 2017-10-12 DIAGNOSIS — K7460 Unspecified cirrhosis of liver: Secondary | ICD-10-CM | POA: Diagnosis not present

## 2017-10-20 DIAGNOSIS — Z6830 Body mass index (BMI) 30.0-30.9, adult: Secondary | ICD-10-CM | POA: Diagnosis not present

## 2017-10-20 DIAGNOSIS — E1165 Type 2 diabetes mellitus with hyperglycemia: Secondary | ICD-10-CM | POA: Diagnosis not present

## 2017-10-20 DIAGNOSIS — Z87891 Personal history of nicotine dependence: Secondary | ICD-10-CM | POA: Diagnosis not present

## 2017-10-20 DIAGNOSIS — N390 Urinary tract infection, site not specified: Secondary | ICD-10-CM | POA: Diagnosis not present

## 2017-12-28 DIAGNOSIS — R748 Abnormal levels of other serum enzymes: Secondary | ICD-10-CM | POA: Diagnosis not present

## 2017-12-28 DIAGNOSIS — K7581 Nonalcoholic steatohepatitis (NASH): Secondary | ICD-10-CM | POA: Diagnosis not present

## 2018-01-11 DIAGNOSIS — Z6830 Body mass index (BMI) 30.0-30.9, adult: Secondary | ICD-10-CM | POA: Diagnosis not present

## 2018-01-11 DIAGNOSIS — J101 Influenza due to other identified influenza virus with other respiratory manifestations: Secondary | ICD-10-CM | POA: Diagnosis not present

## 2018-01-20 DIAGNOSIS — J438 Other emphysema: Secondary | ICD-10-CM | POA: Diagnosis not present

## 2018-01-20 DIAGNOSIS — K7581 Nonalcoholic steatohepatitis (NASH): Secondary | ICD-10-CM | POA: Diagnosis not present

## 2018-01-20 DIAGNOSIS — R809 Proteinuria, unspecified: Secondary | ICD-10-CM | POA: Diagnosis not present

## 2018-01-20 DIAGNOSIS — E1165 Type 2 diabetes mellitus with hyperglycemia: Secondary | ICD-10-CM | POA: Diagnosis not present

## 2018-03-29 ENCOUNTER — Other Ambulatory Visit: Payer: Self-pay | Admitting: Physician Assistant

## 2018-03-29 DIAGNOSIS — K7469 Other cirrhosis of liver: Secondary | ICD-10-CM

## 2018-05-24 ENCOUNTER — Ambulatory Visit
Admission: RE | Admit: 2018-05-24 | Discharge: 2018-05-24 | Disposition: A | Discharge status: Home / Self Care | Payer: BLUE CROSS/BLUE SHIELD | Source: Ambulatory Visit | Attending: Physician Assistant | Admitting: Physician Assistant

## 2018-05-24 ENCOUNTER — Other Ambulatory Visit: Payer: BLUE CROSS/BLUE SHIELD

## 2018-05-24 DIAGNOSIS — K7469 Other cirrhosis of liver: Secondary | ICD-10-CM

## 2018-08-20 DIAGNOSIS — K7469 Other cirrhosis of liver: Secondary | ICD-10-CM | POA: Diagnosis not present

## 2018-09-17 DIAGNOSIS — K7469 Other cirrhosis of liver: Secondary | ICD-10-CM | POA: Diagnosis not present

## 2018-10-11 DIAGNOSIS — R809 Proteinuria, unspecified: Secondary | ICD-10-CM | POA: Diagnosis not present

## 2018-10-11 DIAGNOSIS — E1165 Type 2 diabetes mellitus with hyperglycemia: Secondary | ICD-10-CM | POA: Diagnosis not present

## 2018-10-11 DIAGNOSIS — K7581 Nonalcoholic steatohepatitis (NASH): Secondary | ICD-10-CM | POA: Diagnosis not present

## 2018-10-11 DIAGNOSIS — N390 Urinary tract infection, site not specified: Secondary | ICD-10-CM | POA: Diagnosis not present

## 2018-10-11 DIAGNOSIS — Z1331 Encounter for screening for depression: Secondary | ICD-10-CM | POA: Diagnosis not present

## 2018-12-21 ENCOUNTER — Other Ambulatory Visit: Payer: Self-pay | Admitting: Gastroenterology

## 2018-12-21 DIAGNOSIS — K7469 Other cirrhosis of liver: Secondary | ICD-10-CM

## 2019-01-06 ENCOUNTER — Ambulatory Visit
Admission: RE | Admit: 2019-01-06 | Discharge: 2019-01-06 | Disposition: A | Discharge status: Home / Self Care | Payer: BLUE CROSS/BLUE SHIELD | Source: Ambulatory Visit | Attending: Gastroenterology | Admitting: Gastroenterology

## 2019-01-06 DIAGNOSIS — K7460 Unspecified cirrhosis of liver: Secondary | ICD-10-CM | POA: Diagnosis not present

## 2019-01-06 DIAGNOSIS — K7469 Other cirrhosis of liver: Secondary | ICD-10-CM

## 2019-01-11 DIAGNOSIS — K7469 Other cirrhosis of liver: Secondary | ICD-10-CM | POA: Diagnosis not present

## 2019-02-03 DIAGNOSIS — R5383 Other fatigue: Secondary | ICD-10-CM | POA: Diagnosis not present

## 2019-02-03 DIAGNOSIS — Z01419 Encounter for gynecological examination (general) (routine) without abnormal findings: Secondary | ICD-10-CM | POA: Diagnosis not present

## 2019-02-03 DIAGNOSIS — N9089 Other specified noninflammatory disorders of vulva and perineum: Secondary | ICD-10-CM | POA: Diagnosis not present

## 2019-02-03 DIAGNOSIS — Z6831 Body mass index (BMI) 31.0-31.9, adult: Secondary | ICD-10-CM | POA: Diagnosis not present

## 2019-02-03 DIAGNOSIS — Z1231 Encounter for screening mammogram for malignant neoplasm of breast: Secondary | ICD-10-CM | POA: Diagnosis not present

## 2019-02-03 DIAGNOSIS — R350 Frequency of micturition: Secondary | ICD-10-CM | POA: Diagnosis not present

## 2019-02-03 DIAGNOSIS — K7581 Nonalcoholic steatohepatitis (NASH): Secondary | ICD-10-CM | POA: Diagnosis not present

## 2019-02-03 DIAGNOSIS — E1165 Type 2 diabetes mellitus with hyperglycemia: Secondary | ICD-10-CM | POA: Diagnosis not present

## 2019-04-22 DIAGNOSIS — D1801 Hemangioma of skin and subcutaneous tissue: Secondary | ICD-10-CM | POA: Diagnosis not present

## 2019-04-22 DIAGNOSIS — L570 Actinic keratosis: Secondary | ICD-10-CM | POA: Diagnosis not present

## 2019-04-22 DIAGNOSIS — L820 Inflamed seborrheic keratosis: Secondary | ICD-10-CM | POA: Diagnosis not present

## 2019-04-22 DIAGNOSIS — D225 Melanocytic nevi of trunk: Secondary | ICD-10-CM | POA: Diagnosis not present

## 2019-04-22 DIAGNOSIS — L814 Other melanin hyperpigmentation: Secondary | ICD-10-CM | POA: Diagnosis not present

## 2019-04-22 DIAGNOSIS — L918 Other hypertrophic disorders of the skin: Secondary | ICD-10-CM | POA: Diagnosis not present

## 2019-05-09 DIAGNOSIS — K7581 Nonalcoholic steatohepatitis (NASH): Secondary | ICD-10-CM | POA: Diagnosis not present

## 2019-05-09 DIAGNOSIS — M79671 Pain in right foot: Secondary | ICD-10-CM | POA: Diagnosis not present

## 2019-05-09 DIAGNOSIS — E1165 Type 2 diabetes mellitus with hyperglycemia: Secondary | ICD-10-CM | POA: Diagnosis not present

## 2019-05-09 DIAGNOSIS — R5383 Other fatigue: Secondary | ICD-10-CM | POA: Diagnosis not present

## 2019-05-09 DIAGNOSIS — R809 Proteinuria, unspecified: Secondary | ICD-10-CM | POA: Diagnosis not present

## 2019-05-10 ENCOUNTER — Ambulatory Visit
Admission: RE | Admit: 2019-05-10 | Discharge: 2019-05-10 | Disposition: A | Discharge status: Home / Self Care | Payer: BC Managed Care – PPO | Source: Ambulatory Visit | Attending: Physician Assistant | Admitting: Physician Assistant

## 2019-05-10 ENCOUNTER — Other Ambulatory Visit: Payer: Self-pay | Admitting: Physician Assistant

## 2019-05-10 DIAGNOSIS — M79671 Pain in right foot: Secondary | ICD-10-CM

## 2019-05-10 DIAGNOSIS — M19071 Primary osteoarthritis, right ankle and foot: Secondary | ICD-10-CM | POA: Diagnosis not present

## 2019-05-11 ENCOUNTER — Other Ambulatory Visit: Payer: Self-pay | Admitting: Internal Medicine

## 2019-05-11 DIAGNOSIS — Z1231 Encounter for screening mammogram for malignant neoplasm of breast: Secondary | ICD-10-CM

## 2019-05-25 DIAGNOSIS — R002 Palpitations: Secondary | ICD-10-CM | POA: Diagnosis not present

## 2019-05-25 DIAGNOSIS — M19071 Primary osteoarthritis, right ankle and foot: Secondary | ICD-10-CM | POA: Diagnosis not present

## 2019-05-25 DIAGNOSIS — E11649 Type 2 diabetes mellitus with hypoglycemia without coma: Secondary | ICD-10-CM | POA: Diagnosis not present

## 2019-06-20 DIAGNOSIS — Z86010 Personal history of colonic polyps: Secondary | ICD-10-CM | POA: Diagnosis not present

## 2019-06-20 DIAGNOSIS — K7469 Other cirrhosis of liver: Secondary | ICD-10-CM | POA: Diagnosis not present

## 2019-06-22 ENCOUNTER — Other Ambulatory Visit: Payer: Self-pay | Admitting: Gastroenterology

## 2019-06-22 DIAGNOSIS — K7469 Other cirrhosis of liver: Secondary | ICD-10-CM

## 2019-07-22 ENCOUNTER — Ambulatory Visit
Admission: RE | Admit: 2019-07-22 | Discharge: 2019-07-22 | Disposition: A | Discharge status: Home / Self Care | Payer: BC Managed Care – PPO | Source: Ambulatory Visit | Attending: Gastroenterology | Admitting: Gastroenterology

## 2019-07-22 DIAGNOSIS — K7469 Other cirrhosis of liver: Secondary | ICD-10-CM

## 2019-07-22 DIAGNOSIS — K7460 Unspecified cirrhosis of liver: Secondary | ICD-10-CM | POA: Diagnosis not present

## 2019-08-10 DIAGNOSIS — K7581 Nonalcoholic steatohepatitis (NASH): Secondary | ICD-10-CM | POA: Diagnosis not present

## 2019-08-10 DIAGNOSIS — E1165 Type 2 diabetes mellitus with hyperglycemia: Secondary | ICD-10-CM | POA: Diagnosis not present

## 2019-08-10 DIAGNOSIS — Z6831 Body mass index (BMI) 31.0-31.9, adult: Secondary | ICD-10-CM | POA: Diagnosis not present

## 2019-10-05 DIAGNOSIS — Z20822 Contact with and (suspected) exposure to covid-19: Secondary | ICD-10-CM | POA: Diagnosis not present

## 2019-10-05 DIAGNOSIS — E1165 Type 2 diabetes mellitus with hyperglycemia: Secondary | ICD-10-CM | POA: Diagnosis not present

## 2019-10-12 DIAGNOSIS — Z79899 Other long term (current) drug therapy: Secondary | ICD-10-CM | POA: Diagnosis not present

## 2019-10-12 DIAGNOSIS — U071 COVID-19: Secondary | ICD-10-CM | POA: Diagnosis not present

## 2019-10-16 DIAGNOSIS — Z9049 Acquired absence of other specified parts of digestive tract: Secondary | ICD-10-CM | POA: Diagnosis not present

## 2019-10-16 DIAGNOSIS — R0602 Shortness of breath: Secondary | ICD-10-CM | POA: Diagnosis not present

## 2019-10-16 DIAGNOSIS — E669 Obesity, unspecified: Secondary | ICD-10-CM | POA: Diagnosis not present

## 2019-10-16 DIAGNOSIS — J9690 Respiratory failure, unspecified, unspecified whether with hypoxia or hypercapnia: Secondary | ICD-10-CM | POA: Diagnosis not present

## 2019-10-16 DIAGNOSIS — J189 Pneumonia, unspecified organism: Secondary | ICD-10-CM | POA: Diagnosis not present

## 2019-10-16 DIAGNOSIS — M79605 Pain in left leg: Secondary | ICD-10-CM | POA: Diagnosis not present

## 2019-10-16 DIAGNOSIS — J1282 Pneumonia due to coronavirus disease 2019: Secondary | ICD-10-CM | POA: Diagnosis not present

## 2019-10-16 DIAGNOSIS — F17290 Nicotine dependence, other tobacco product, uncomplicated: Secondary | ICD-10-CM | POA: Diagnosis not present

## 2019-10-16 DIAGNOSIS — E119 Type 2 diabetes mellitus without complications: Secondary | ICD-10-CM | POA: Diagnosis not present

## 2019-10-16 DIAGNOSIS — Z794 Long term (current) use of insulin: Secondary | ICD-10-CM | POA: Diagnosis not present

## 2019-10-16 DIAGNOSIS — U071 COVID-19: Secondary | ICD-10-CM | POA: Diagnosis not present

## 2019-10-16 DIAGNOSIS — R600 Localized edema: Secondary | ICD-10-CM | POA: Diagnosis not present

## 2019-10-16 DIAGNOSIS — J9601 Acute respiratory failure with hypoxia: Secondary | ICD-10-CM | POA: Diagnosis not present

## 2019-10-16 DIAGNOSIS — R059 Cough, unspecified: Secondary | ICD-10-CM | POA: Diagnosis not present

## 2019-10-16 DIAGNOSIS — Z87440 Personal history of urinary (tract) infections: Secondary | ICD-10-CM | POA: Diagnosis not present

## 2019-10-16 DIAGNOSIS — M79604 Pain in right leg: Secondary | ICD-10-CM | POA: Diagnosis not present

## 2019-10-16 DIAGNOSIS — Z6831 Body mass index (BMI) 31.0-31.9, adult: Secondary | ICD-10-CM | POA: Diagnosis not present

## 2019-10-16 DIAGNOSIS — Z79899 Other long term (current) drug therapy: Secondary | ICD-10-CM | POA: Diagnosis not present

## 2019-10-17 DIAGNOSIS — J9601 Acute respiratory failure with hypoxia: Secondary | ICD-10-CM

## 2019-10-25 DIAGNOSIS — E1165 Type 2 diabetes mellitus with hyperglycemia: Secondary | ICD-10-CM | POA: Diagnosis not present

## 2019-10-25 DIAGNOSIS — K7460 Unspecified cirrhosis of liver: Secondary | ICD-10-CM | POA: Diagnosis not present

## 2019-10-25 DIAGNOSIS — U071 COVID-19: Secondary | ICD-10-CM | POA: Diagnosis not present

## 2019-10-25 DIAGNOSIS — Z23 Encounter for immunization: Secondary | ICD-10-CM | POA: Diagnosis not present

## 2019-10-25 DIAGNOSIS — Z79899 Other long term (current) drug therapy: Secondary | ICD-10-CM | POA: Diagnosis not present

## 2019-10-25 DIAGNOSIS — J1282 Pneumonia due to coronavirus disease 2019: Secondary | ICD-10-CM | POA: Diagnosis not present

## 2019-10-25 DIAGNOSIS — Z1331 Encounter for screening for depression: Secondary | ICD-10-CM | POA: Diagnosis not present

## 2019-10-25 DIAGNOSIS — J9601 Acute respiratory failure with hypoxia: Secondary | ICD-10-CM | POA: Diagnosis not present

## 2019-11-08 DIAGNOSIS — E1165 Type 2 diabetes mellitus with hyperglycemia: Secondary | ICD-10-CM | POA: Diagnosis not present

## 2019-11-08 DIAGNOSIS — K7581 Nonalcoholic steatohepatitis (NASH): Secondary | ICD-10-CM | POA: Diagnosis not present

## 2019-11-08 DIAGNOSIS — M65312 Trigger thumb, left thumb: Secondary | ICD-10-CM | POA: Diagnosis not present

## 2019-11-08 DIAGNOSIS — U071 COVID-19: Secondary | ICD-10-CM | POA: Diagnosis not present

## 2019-11-29 DIAGNOSIS — K7460 Unspecified cirrhosis of liver: Secondary | ICD-10-CM | POA: Diagnosis not present

## 2019-11-29 DIAGNOSIS — R609 Edema, unspecified: Secondary | ICD-10-CM | POA: Diagnosis not present

## 2019-11-29 DIAGNOSIS — M65312 Trigger thumb, left thumb: Secondary | ICD-10-CM | POA: Diagnosis not present

## 2019-11-29 DIAGNOSIS — E1165 Type 2 diabetes mellitus with hyperglycemia: Secondary | ICD-10-CM | POA: Diagnosis not present

## 2019-12-06 DIAGNOSIS — M65312 Trigger thumb, left thumb: Secondary | ICD-10-CM | POA: Diagnosis not present

## 2019-12-06 DIAGNOSIS — Z6831 Body mass index (BMI) 31.0-31.9, adult: Secondary | ICD-10-CM | POA: Diagnosis not present

## 2019-12-06 DIAGNOSIS — M7552 Bursitis of left shoulder: Secondary | ICD-10-CM | POA: Diagnosis not present

## 2019-12-06 DIAGNOSIS — E1165 Type 2 diabetes mellitus with hyperglycemia: Secondary | ICD-10-CM | POA: Diagnosis not present

## 2020-01-16 ENCOUNTER — Other Ambulatory Visit: Payer: Self-pay | Admitting: Gastroenterology

## 2020-01-16 DIAGNOSIS — K7469 Other cirrhosis of liver: Secondary | ICD-10-CM

## 2020-02-03 ENCOUNTER — Ambulatory Visit
Admission: RE | Admit: 2020-02-03 | Discharge: 2020-02-03 | Disposition: A | Discharge status: Home / Self Care | Payer: BC Managed Care – PPO | Source: Ambulatory Visit | Attending: Gastroenterology | Admitting: Gastroenterology

## 2020-02-03 DIAGNOSIS — N2889 Other specified disorders of kidney and ureter: Secondary | ICD-10-CM | POA: Diagnosis not present

## 2020-02-03 DIAGNOSIS — K7469 Other cirrhosis of liver: Secondary | ICD-10-CM

## 2020-02-03 DIAGNOSIS — K7689 Other specified diseases of liver: Secondary | ICD-10-CM | POA: Diagnosis not present

## 2020-02-08 DIAGNOSIS — K7460 Unspecified cirrhosis of liver: Secondary | ICD-10-CM | POA: Diagnosis not present

## 2020-02-08 DIAGNOSIS — Z6832 Body mass index (BMI) 32.0-32.9, adult: Secondary | ICD-10-CM | POA: Diagnosis not present

## 2020-02-08 DIAGNOSIS — E1165 Type 2 diabetes mellitus with hyperglycemia: Secondary | ICD-10-CM | POA: Diagnosis not present

## 2020-04-06 DIAGNOSIS — Z20822 Contact with and (suspected) exposure to covid-19: Secondary | ICD-10-CM | POA: Diagnosis not present

## 2020-04-16 DIAGNOSIS — Z6831 Body mass index (BMI) 31.0-31.9, adult: Secondary | ICD-10-CM | POA: Diagnosis not present

## 2020-04-16 DIAGNOSIS — Z124 Encounter for screening for malignant neoplasm of cervix: Secondary | ICD-10-CM | POA: Diagnosis not present

## 2020-04-16 DIAGNOSIS — Z1231 Encounter for screening mammogram for malignant neoplasm of breast: Secondary | ICD-10-CM | POA: Diagnosis not present

## 2020-04-16 DIAGNOSIS — Z01411 Encounter for gynecological examination (general) (routine) with abnormal findings: Secondary | ICD-10-CM | POA: Diagnosis not present

## 2020-04-16 DIAGNOSIS — Z01419 Encounter for gynecological examination (general) (routine) without abnormal findings: Secondary | ICD-10-CM | POA: Diagnosis not present

## 2020-04-18 ENCOUNTER — Other Ambulatory Visit: Payer: Self-pay | Admitting: Obstetrics and Gynecology

## 2020-04-18 DIAGNOSIS — N951 Menopausal and female climacteric states: Secondary | ICD-10-CM

## 2020-04-24 DIAGNOSIS — D225 Melanocytic nevi of trunk: Secondary | ICD-10-CM | POA: Diagnosis not present

## 2020-04-24 DIAGNOSIS — L650 Telogen effluvium: Secondary | ICD-10-CM | POA: Diagnosis not present

## 2020-04-24 DIAGNOSIS — L814 Other melanin hyperpigmentation: Secondary | ICD-10-CM | POA: Diagnosis not present

## 2020-04-24 DIAGNOSIS — R531 Weakness: Secondary | ICD-10-CM | POA: Diagnosis not present

## 2020-04-24 DIAGNOSIS — L820 Inflamed seborrheic keratosis: Secondary | ICD-10-CM | POA: Diagnosis not present

## 2020-04-24 DIAGNOSIS — L578 Other skin changes due to chronic exposure to nonionizing radiation: Secondary | ICD-10-CM | POA: Diagnosis not present

## 2020-05-08 DIAGNOSIS — Z6832 Body mass index (BMI) 32.0-32.9, adult: Secondary | ICD-10-CM | POA: Diagnosis not present

## 2020-05-08 DIAGNOSIS — K7581 Nonalcoholic steatohepatitis (NASH): Secondary | ICD-10-CM | POA: Diagnosis not present

## 2020-05-08 DIAGNOSIS — E0590 Thyrotoxicosis, unspecified without thyrotoxic crisis or storm: Secondary | ICD-10-CM | POA: Diagnosis not present

## 2020-05-08 DIAGNOSIS — E1165 Type 2 diabetes mellitus with hyperglycemia: Secondary | ICD-10-CM | POA: Diagnosis not present

## 2020-05-14 DIAGNOSIS — Z6831 Body mass index (BMI) 31.0-31.9, adult: Secondary | ICD-10-CM | POA: Diagnosis not present

## 2020-05-14 DIAGNOSIS — Z6832 Body mass index (BMI) 32.0-32.9, adult: Secondary | ICD-10-CM | POA: Diagnosis not present

## 2020-05-14 DIAGNOSIS — M7552 Bursitis of left shoulder: Secondary | ICD-10-CM | POA: Diagnosis not present

## 2020-06-14 ENCOUNTER — Emergency Department
Admission: EM | Admit: 2020-06-14 | Discharge: 2020-06-14 | Disposition: A | Discharge status: Home / Self Care | Payer: BC Managed Care – PPO | Attending: Emergency Medicine | Admitting: Emergency Medicine

## 2020-06-14 ENCOUNTER — Emergency Department: Payer: BC Managed Care – PPO

## 2020-06-14 ENCOUNTER — Other Ambulatory Visit: Payer: Self-pay

## 2020-06-14 DIAGNOSIS — Z7984 Long term (current) use of oral hypoglycemic drugs: Secondary | ICD-10-CM | POA: Insufficient documentation

## 2020-06-14 DIAGNOSIS — R079 Chest pain, unspecified: Secondary | ICD-10-CM | POA: Diagnosis not present

## 2020-06-14 DIAGNOSIS — E119 Type 2 diabetes mellitus without complications: Secondary | ICD-10-CM | POA: Diagnosis not present

## 2020-06-14 DIAGNOSIS — E1165 Type 2 diabetes mellitus with hyperglycemia: Secondary | ICD-10-CM | POA: Diagnosis not present

## 2020-06-14 DIAGNOSIS — I213 ST elevation (STEMI) myocardial infarction of unspecified site: Secondary | ICD-10-CM | POA: Diagnosis not present

## 2020-06-14 DIAGNOSIS — Z8616 Personal history of COVID-19: Secondary | ICD-10-CM | POA: Insufficient documentation

## 2020-06-14 DIAGNOSIS — Z20822 Contact with and (suspected) exposure to covid-19: Secondary | ICD-10-CM | POA: Diagnosis not present

## 2020-06-14 DIAGNOSIS — R0789 Other chest pain: Secondary | ICD-10-CM | POA: Diagnosis not present

## 2020-06-14 DIAGNOSIS — I499 Cardiac arrhythmia, unspecified: Secondary | ICD-10-CM | POA: Diagnosis not present

## 2020-06-14 LAB — CBC
HCT: 44.7 % (ref 36.0–46.0)
Hemoglobin: 14.9 g/dL (ref 12.0–15.0)
MCH: 28.7 pg (ref 26.0–34.0)
MCHC: 33.3 g/dL (ref 30.0–36.0)
MCV: 86.1 fL (ref 80.0–100.0)
Platelets: 189 10*3/uL (ref 150–400)
RBC: 5.19 MIL/uL — ABNORMAL HIGH (ref 3.87–5.11)
RDW: 13.2 % (ref 11.5–15.5)
WBC: 13.6 10*3/uL — ABNORMAL HIGH (ref 4.0–10.5)
nRBC: 0 % (ref 0.0–0.2)

## 2020-06-14 LAB — URINALYSIS, ROUTINE W REFLEX MICROSCOPIC
Bilirubin Urine: NEGATIVE
Glucose, UA: >=500 mg/dL — AB
Ketones, ur: NEGATIVE mg/dL
Nitrite: NEGATIVE
Protein, ur: NEGATIVE mg/dL
Specific Gravity, Urine: 1.003 — ABNORMAL LOW (ref 1.005–1.030)
pH: 6 (ref 5.0–8.0)

## 2020-06-14 LAB — TROPONIN I (HIGH SENSITIVITY)
Troponin I (High Sensitivity): 4 ng/L (ref <18)
Troponin I (High Sensitivity): 4 ng/L (ref <18)

## 2020-06-14 LAB — BASIC METABOLIC PANEL
Anion gap: 9 (ref 5–15)
BUN: 16 mg/dL (ref 6–20)
CO2: 24 mmol/L (ref 22–32)
Calcium: 9.3 mg/dL (ref 8.9–10.3)
Chloride: 103 mmol/L (ref 98–111)
Creatinine, Ser: 0.67 mg/dL (ref 0.44–1.00)
GFR, Estimated: >60 mL/min (ref >60)
Glucose, Bld: 220 mg/dL — ABNORMAL HIGH (ref 70–99)
Potassium: 3.8 mmol/L (ref 3.5–5.1)
Sodium: 136 mmol/L (ref 135–145)

## 2020-06-14 LAB — RESP PANEL BY RT-PCR (FLU A&B, COVID) ARPGX2
Influenza A by PCR: NEGATIVE
Influenza B by PCR: NEGATIVE
SARS Coronavirus 2 by RT PCR: NEGATIVE

## 2020-06-14 MED ORDER — SODIUM CHLORIDE 0.9 % IV SOLN: Freq: Once | INTRAVENOUS | Status: AC

## 2020-06-14 MED ORDER — TIZANIDINE HCL 2 MG PO CAPS: 2.0000 mg | ORAL_CAPSULE | Freq: Three times a day (TID) | ORAL | 0 refills | Status: AC | PRN

## 2020-06-14 MED ORDER — TIZANIDINE HCL 4 MG PO TABS
4.0000 mg | ORAL_TABLET | Freq: Once | ORAL | Status: AC
  Administered 2020-06-14: 4 mg via ORAL
  Filled 2020-06-14: qty 1

## 2020-06-14 NOTE — ED Triage Notes (Addendum)
Pt from home with a chief complaint of chest pain. Pt stated pain started aprox at 7pm yesterday , describes pain as sharp associated with sob.  Pt stated has taken aprox 8-baby asa today

## 2020-06-14 NOTE — ED Notes (Signed)
Advised MD. Vicente Males regarding pt blood pressure , advised to order 1L of Normal saline and reassess

## 2020-06-14 NOTE — ED Notes (Signed)
Sent message to PA regarding pt bp

## 2020-06-14 NOTE — ED Provider Notes (Signed)
[facility] Emergency Department Provider Note  ____________________________________________   Event Date/Time   First MD Initiated Contact with Patient 06/14/20 1102     (approximate)  I have reviewed the triage vital signs and the nursing notes.   HISTORY  Chief Complaint Chest Pain   HPI Linda Burke is a 60 y.o. female with a history of diabetes and cryptogenic cirrhosis presents to the emergency department for treatment and evaluation of chest pain that started while at rest last evening around 7pm. She denies cardiac history. She "vapes," but denies cigarette smoking. She had COVID last fall. No alleviating measures prior to arrival.  No past medical history on file.  There are no problems to display for this patient.  Prior to Admission medications   Medication Sig Start Date End Date Taking? Authorizing Provider  tizanidine (ZANAFLEX) 2 MG capsule Take 1 capsule (2 mg total) by mouth 3 (three) times daily as needed (chest wall pain). 06/14/20  Yes Merwyn Katos, MD  metFORMIN (GLUCOPHAGE-XR) 500 MG 24 hr tablet Take 2,000 mg by mouth daily with breakfast.    [provider]  sitaGLIPtin (JANUVIA) 100 MG tablet Take 100 mg by mouth daily.    [provider]    Allergies Adhesive [tape], Glipizide, and Prednisone  No family history on file.  Social History    Review of Systems  Constitutional: No fever/chills. Eyes: No visual changes. ENT: No sore throat. Cardiovascular: Positive for chest pain. Negative for pleuritic pain. Negative for palpitations. Negative for leg pain. Respiratory: Positive for shortness of breath. Gastrointestinal: Negative for abdominal pain. No nausea, no vomiting.  No diarrhea.  No constipation. Genitourinary: Negative for dysuria. Musculoskeletal: Negative for back pain.  Skin: Negative for rash, lesion, wound. Neurological: Negative for headaches, focal weakness or  numbness. ___________________________________________   PHYSICAL EXAM:  VITAL SIGNS: ED Triage Vitals  Enc Vitals Group     BP 06/14/20 1032 139/67     Pulse Rate 06/14/20 1032 93     Resp 06/14/20 1032 17     Temp 06/14/20 1032 98 F (36.7 C)     Temp Source 06/14/20 1032 Oral     SpO2 06/14/20 1032 98 %     Weight 06/14/20 1033 154 lb 15.7 oz (70.3 kg)     Height 06/14/20 1033 5\' 4"  (1.626 m)     Head Circumference --      Peak Flow --      Pain Score 06/14/20 1032 7     Pain Loc --      Pain Edu? --      Excl. in GC? --     Constitutional: Alert and oriented. Well appearing and in no acute distress. Normal mental status. Eyes: Conjunctivae are normal. PERRL. Head: Atraumatic. Nose: No congestion/rhinnorhea. Mouth/Throat: Mucous membranes are moist.  Oropharynx non-erythematous. Tongue normal in size and color. Neck: No stridor. No carotid bruit appreciated on exam. Hematological/Lymphatic/Immunilogical: No cervical lymphadenopathy. Cardiovascular: Normal rate, regular rhythm. Grossly normal heart sounds.  Good peripheral circulation. Respiratory: Normal respiratory effort.  No retractions. Lungs CTAB. Gastrointestinal: Soft and nontender. No distention. No abdominal bruits. No CVA tenderness. Genitourinary: Exam deferred. Musculoskeletal: No lower extremity tenderness. No edema of extremities. Neurologic:  Normal speech and language. No gross focal neurologic deficits are appreciated. Skin:  Skin is warm, dry and intact. No rash noted. Psychiatric: Mood and affect are normal. Speech and behavior are normal.  ____________________________________________   LABS (all labs ordered are listed,  but only abnormal results are displayed)  Labs Reviewed  CBC - Abnormal; Notable for the following components:      Result Value   WBC 13.6 (*)    RBC 5.19 (*)    All other components within normal limits  URINALYSIS, ROUTINE W REFLEX MICROSCOPIC - Abnormal; Notable for the  following components:   Color, Urine YELLOW (*)    APPearance CLEAR (*)    Specific Gravity, Urine 1.003 (*)    Glucose, UA >=500 (*)    Hgb urine dipstick MODERATE (*)    Leukocytes,Ua TRACE (*)    Bacteria, UA MANY (*)    All other components within normal limits  BASIC METABOLIC PANEL - Abnormal; Notable for the following components:   Glucose, Bld 220 (*)    All other components within normal limits  RESP PANEL BY RT-PCR (FLU A&B, COVID) ARPGX2  TROPONIN I (HIGH SENSITIVITY)  TROPONIN I (HIGH SENSITIVITY)   ____________________________________________  EKG  ED ECG REPORT I, Kaelem Brach, FNP-BC personally viewed and interpreted this ECG.   Date: 06/14/2020  EKG Time: 1032  Rate: 87  Rhythm: normal EKG, normal sinus rhythm  Axis: normal  Intervals:none  ST&T Change: no ST elevation  ____________________________________________  RADIOLOGY  ED MD interpretation:  Chest x-ray negative for acute concerns.  I, Kem Boroughs, personally viewed and evaluated these images (plain radiographs) as part of my medical decision making, as well as reviewing the written report by the radiologist.  Official radiology report(s): DG Chest 2 View  Result Date: 06/14/2020 CLINICAL DATA:  Chest pain. EXAM: CHEST - 2 VIEW COMPARISON:  10/20/2019 FINDINGS: The heart size and mediastinal contours are within normal limits. Both lungs are clear. No visible pleural effusions or pneumothorax. No acute osseous abnormality. Osteopenia. IMPRESSION: No active cardiopulmonary disease. Electronically Signed   By: Feliberto Harts MD   On: 06/14/2020 11:37    ____________________________________________   PROCEDURES  Procedure(s) performed: None  Procedures  Critical Care performed: No  ____________________________________________   INITIAL IMPRESSION / ASSESSMENT AND PLAN   Cardiac event, atypical chest pain, COVID  Differential diagnosis includes, but not limited to:  Differential  includes, but is not limited to, viral syndrome, bronchitis including COPD exacerbation, reactive airway disease including asthma, CHF including exacerbation with or without pulmonary/interstitial edema, pneumothorax, ACS, thoracic trauma, and pulmonary embolism, ACS, aortic dissection, pulmonary embolism, cardiac tamponade, pneumothorax, pneumonia, pericarditis, myocarditis, GI-related causes including esophagitis/gastritis, and musculoskeletal chest wall pain.    ED COURSE  Awaiting second troponin. Exam is overall reassuring. Care transitioned to Dr. Vicente Males for disposition.     FINAL CLINICAL IMPRESSION(S) / ED DIAGNOSES  Final diagnoses:  Chest wall pain  Atypical chest pain     ED Discharge Orders          Ordered    tizanidine (ZANAFLEX) 2 MG capsule  3 times daily PRN        06/14/20 1356             Linda Burke was evaluated in Emergency Department on 06/14/2020 for the symptoms described in the history of present illness. She was evaluated in the context of the global COVID-19 pandemic, which necessitated consideration that the patient might be at risk for infection with the SARS-CoV-2 virus that causes COVID-19. Institutional protocols and algorithms that pertain to the evaluation of patients at risk for COVID-19 are in a state of rapid change based on information released by regulatory bodies including the CDC and federal and state  organizations. These policies and algorithms were followed during the patient's care in the ED.   Note:  This document was prepared using Dragon voice recognition software and may include unintentional dictation errors.    Chinita Pester, FNP 06/14/20 Ronie Spies, MD 06/15/20 [phone]

## 2020-06-14 NOTE — ED Notes (Signed)
Another green top sent to lab   Pt taken to xray

## 2020-06-27 DIAGNOSIS — R9431 Abnormal electrocardiogram [ECG] [EKG]: Secondary | ICD-10-CM | POA: Diagnosis not present

## 2020-06-27 DIAGNOSIS — I208 Other forms of angina pectoris: Secondary | ICD-10-CM | POA: Diagnosis not present

## 2020-07-24 DIAGNOSIS — L820 Inflamed seborrheic keratosis: Secondary | ICD-10-CM | POA: Diagnosis not present

## 2020-07-24 DIAGNOSIS — L650 Telogen effluvium: Secondary | ICD-10-CM | POA: Diagnosis not present

## 2020-07-24 DIAGNOSIS — L918 Other hypertrophic disorders of the skin: Secondary | ICD-10-CM | POA: Diagnosis not present

## 2020-07-30 ENCOUNTER — Other Ambulatory Visit: Payer: Self-pay | Admitting: Gastroenterology

## 2020-07-30 DIAGNOSIS — K7469 Other cirrhosis of liver: Secondary | ICD-10-CM

## 2020-08-06 DIAGNOSIS — E1129 Type 2 diabetes mellitus with other diabetic kidney complication: Secondary | ICD-10-CM | POA: Diagnosis not present

## 2020-08-06 DIAGNOSIS — K7460 Unspecified cirrhosis of liver: Secondary | ICD-10-CM | POA: Diagnosis not present

## 2020-08-06 DIAGNOSIS — R9431 Abnormal electrocardiogram [ECG] [EKG]: Secondary | ICD-10-CM | POA: Diagnosis not present

## 2020-08-06 DIAGNOSIS — I208 Other forms of angina pectoris: Secondary | ICD-10-CM | POA: Diagnosis not present

## 2020-08-06 DIAGNOSIS — N390 Urinary tract infection, site not specified: Secondary | ICD-10-CM | POA: Diagnosis not present

## 2020-08-06 DIAGNOSIS — R809 Proteinuria, unspecified: Secondary | ICD-10-CM | POA: Diagnosis not present

## 2020-08-13 DIAGNOSIS — I208 Other forms of angina pectoris: Secondary | ICD-10-CM | POA: Diagnosis not present

## 2020-08-13 DIAGNOSIS — R0602 Shortness of breath: Secondary | ICD-10-CM | POA: Diagnosis not present

## 2020-08-13 DIAGNOSIS — I10 Essential (primary) hypertension: Secondary | ICD-10-CM | POA: Diagnosis not present

## 2020-08-17 ENCOUNTER — Ambulatory Visit
Admission: RE | Admit: 2020-08-17 | Discharge: 2020-08-17 | Disposition: A | Discharge status: Home / Self Care | Payer: BC Managed Care – PPO | Source: Ambulatory Visit | Attending: Gastroenterology | Admitting: Gastroenterology

## 2020-08-17 DIAGNOSIS — K7469 Other cirrhosis of liver: Secondary | ICD-10-CM

## 2020-08-17 DIAGNOSIS — K7689 Other specified diseases of liver: Secondary | ICD-10-CM | POA: Diagnosis not present

## 2020-08-24 DIAGNOSIS — N390 Urinary tract infection, site not specified: Secondary | ICD-10-CM | POA: Diagnosis not present

## 2020-09-06 DIAGNOSIS — K621 Rectal polyp: Secondary | ICD-10-CM | POA: Diagnosis not present

## 2020-09-06 DIAGNOSIS — K640 First degree hemorrhoids: Secondary | ICD-10-CM | POA: Diagnosis not present

## 2020-09-06 DIAGNOSIS — D120 Benign neoplasm of cecum: Secondary | ICD-10-CM | POA: Diagnosis not present

## 2020-09-06 DIAGNOSIS — Z86010 Personal history of colonic polyps: Secondary | ICD-10-CM | POA: Diagnosis not present

## 2020-10-25 ENCOUNTER — Other Ambulatory Visit: Payer: Self-pay

## 2020-10-25 ENCOUNTER — Ambulatory Visit
Admission: RE | Admit: 2020-10-25 | Discharge: 2020-10-25 | Disposition: A | Discharge status: Home / Self Care | Payer: BC Managed Care – PPO | Source: Ambulatory Visit | Attending: Obstetrics and Gynecology | Admitting: Obstetrics and Gynecology

## 2020-10-25 DIAGNOSIS — N951 Menopausal and female climacteric states: Secondary | ICD-10-CM

## 2020-10-25 DIAGNOSIS — M85852 Other specified disorders of bone density and structure, left thigh: Secondary | ICD-10-CM | POA: Diagnosis not present

## 2020-11-06 DIAGNOSIS — Z1331 Encounter for screening for depression: Secondary | ICD-10-CM | POA: Diagnosis not present

## 2020-11-06 DIAGNOSIS — M7552 Bursitis of left shoulder: Secondary | ICD-10-CM | POA: Diagnosis not present

## 2020-11-06 DIAGNOSIS — E1129 Type 2 diabetes mellitus with other diabetic kidney complication: Secondary | ICD-10-CM | POA: Diagnosis not present

## 2020-11-06 DIAGNOSIS — K7460 Unspecified cirrhosis of liver: Secondary | ICD-10-CM | POA: Diagnosis not present

## 2020-11-06 DIAGNOSIS — R809 Proteinuria, unspecified: Secondary | ICD-10-CM | POA: Diagnosis not present

## 2021-02-15 ENCOUNTER — Other Ambulatory Visit: Payer: Self-pay | Admitting: Gastroenterology

## 2021-02-15 DIAGNOSIS — K7469 Other cirrhosis of liver: Secondary | ICD-10-CM

## 2021-03-01 ENCOUNTER — Ambulatory Visit
Admission: RE | Admit: 2021-03-01 | Discharge: 2021-03-01 | Disposition: A | Discharge status: Home / Self Care | Payer: BC Managed Care – PPO | Source: Ambulatory Visit | Attending: Gastroenterology | Admitting: Gastroenterology

## 2021-03-01 DIAGNOSIS — K7469 Other cirrhosis of liver: Secondary | ICD-10-CM

## 2021-08-01 IMAGING — CR DG FOOT COMPLETE 3+V*R*
3 series · 3 of 3 positions shown · non-contrast
Comparison: No prior.

CLINICAL DATA: Right foot pain.  No injury.

EXAM:
RIGHT FOOT COMPLETE - 3+ VIEW

[t foot ap right]
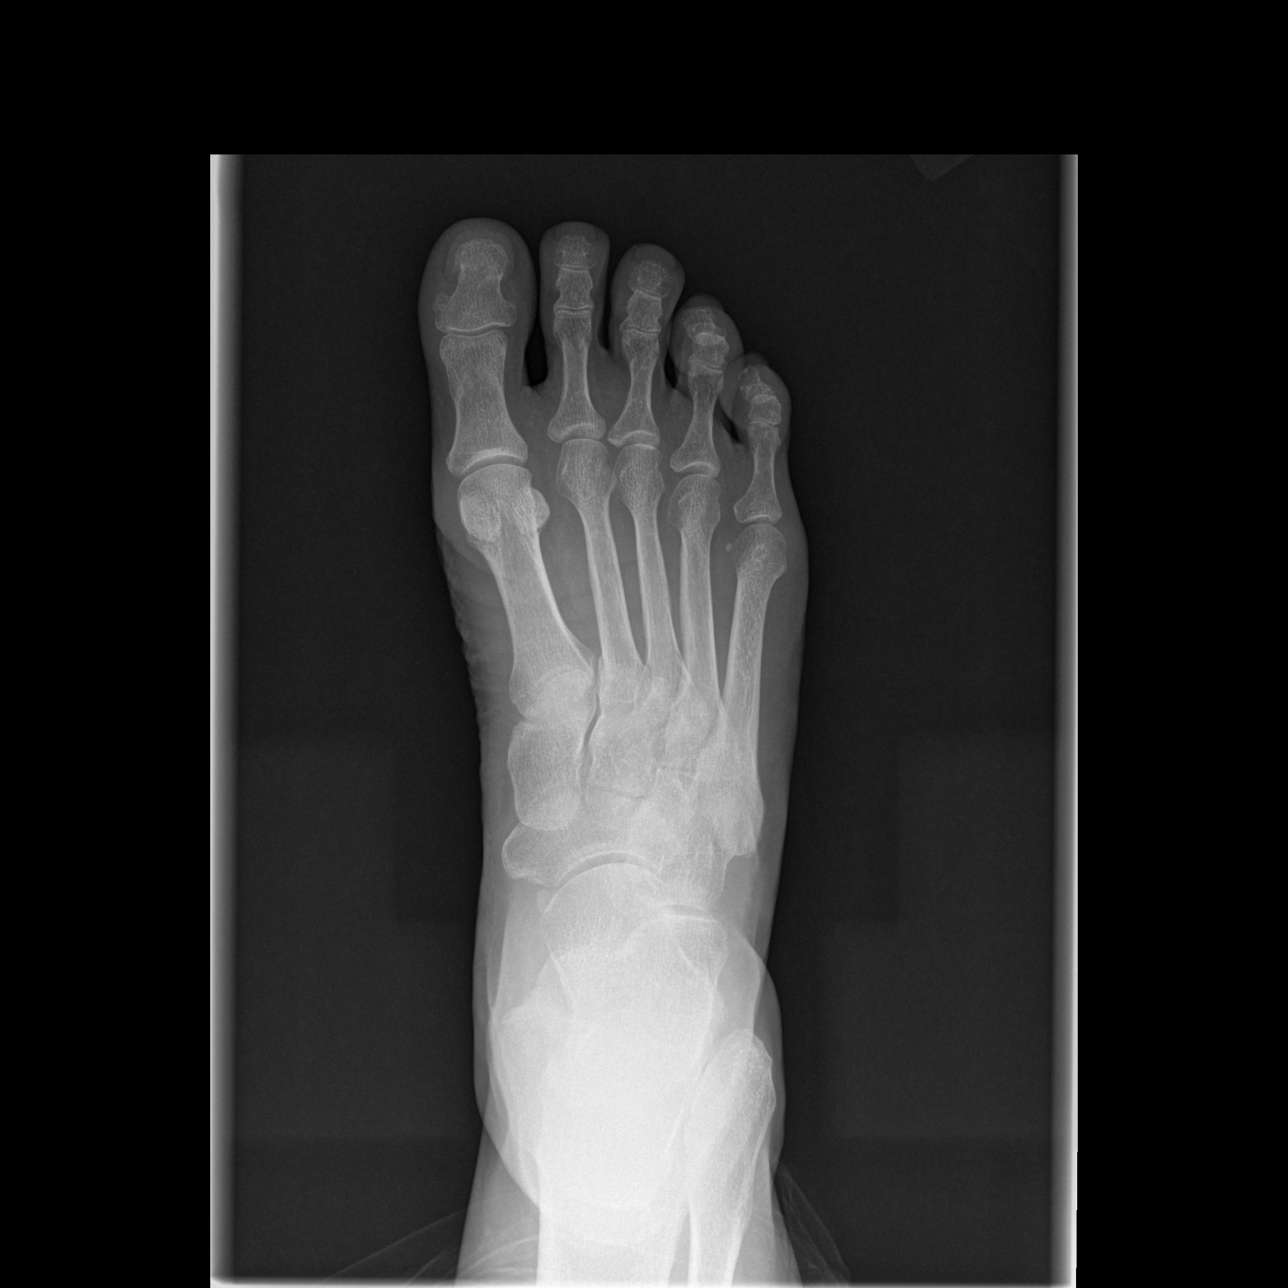

[t foot oblique right]
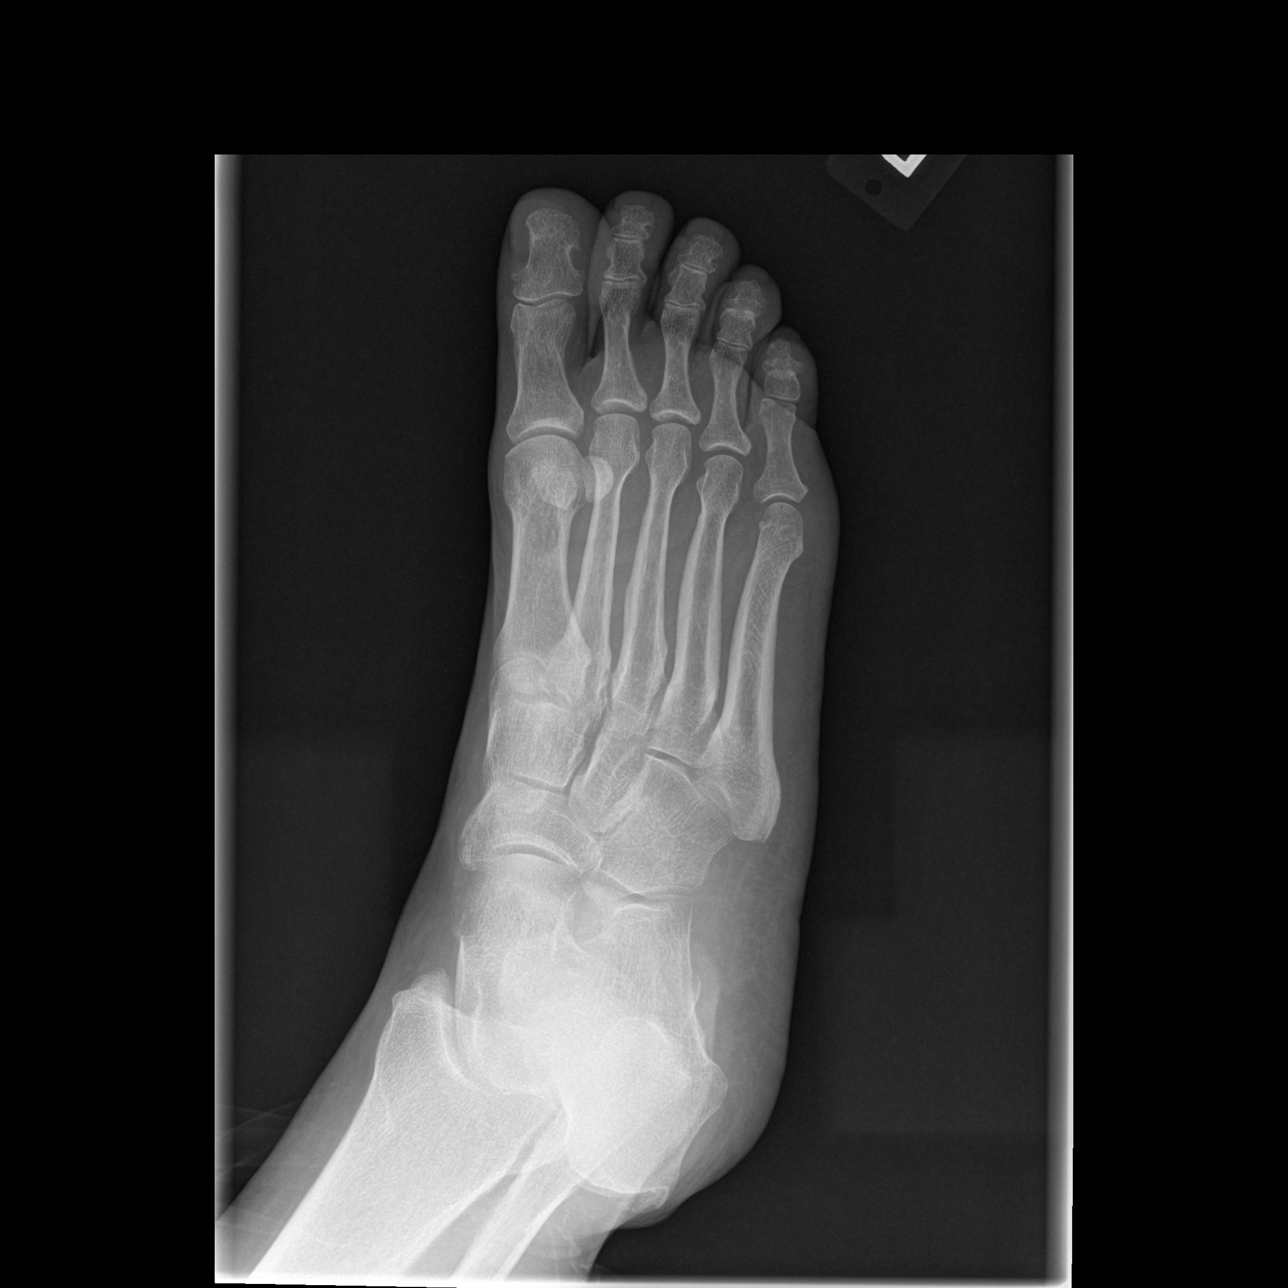

[t foot lat right]
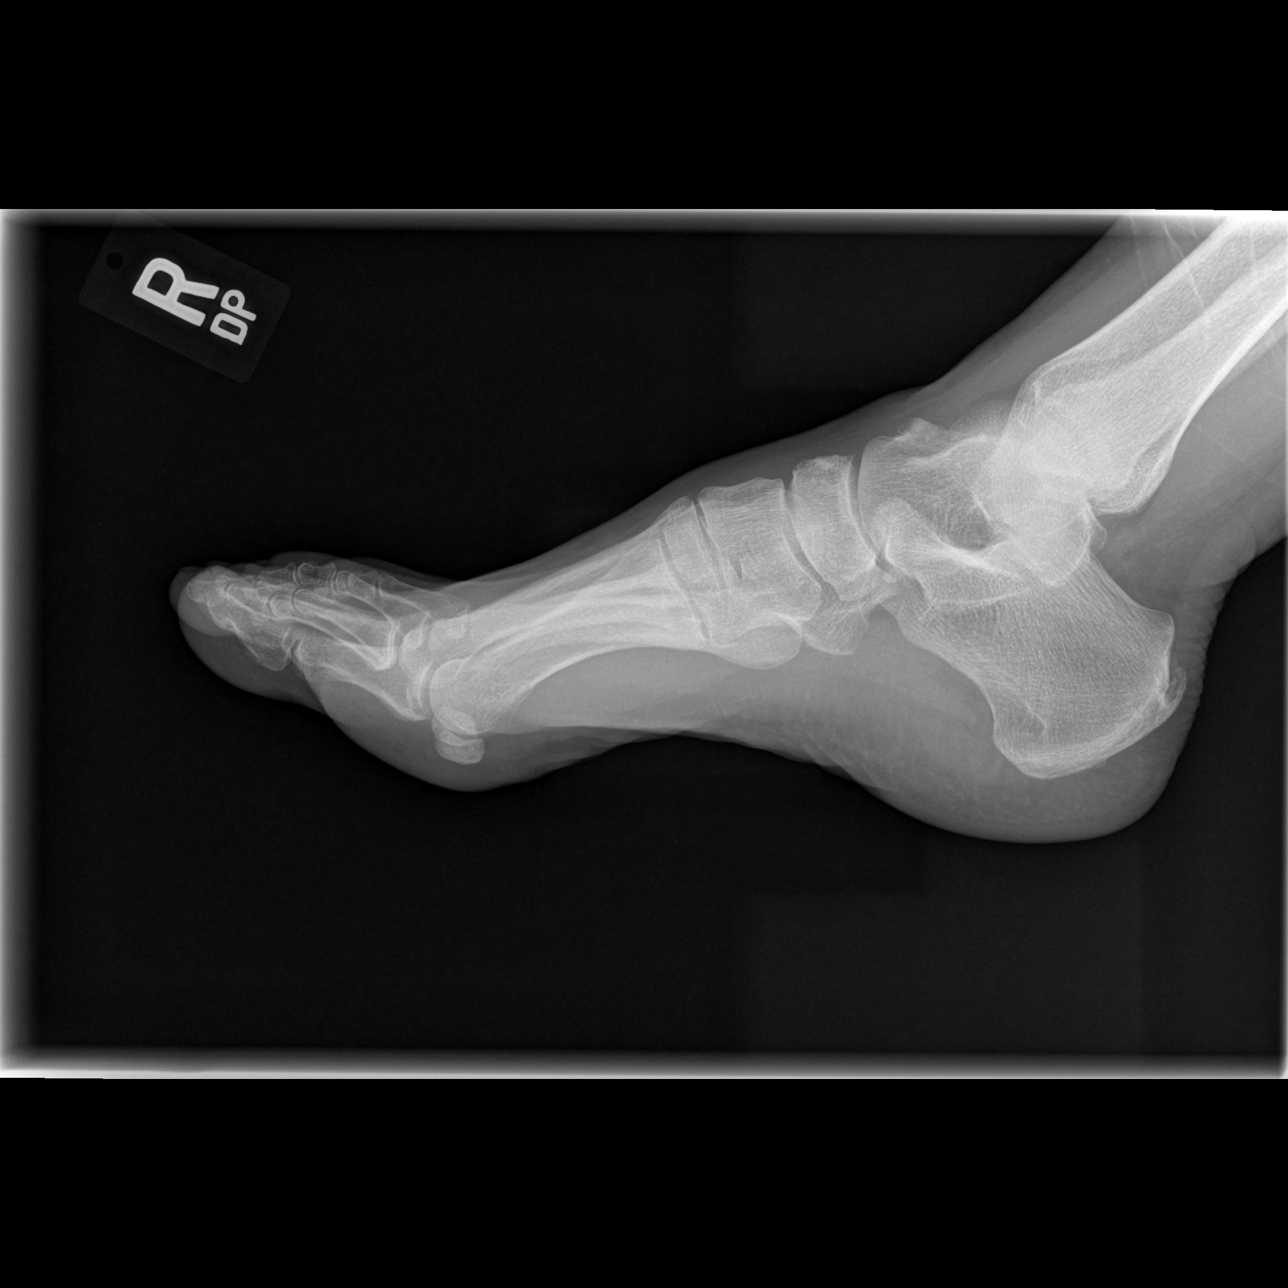

[3 of 3 positions shown; findings below may reference images not displayed]

FINDINGS: Diffuse mild degenerative change. No acute bony or joint abnormality
identified. No evidence of fracture dislocation. No radiopaque
foreign body.
IMPRESSION: Diffuse mild degenerative change.  No acute abnormality.

## 2021-08-23 ENCOUNTER — Other Ambulatory Visit: Payer: Self-pay | Admitting: Gastroenterology

## 2021-08-23 DIAGNOSIS — K7469 Other cirrhosis of liver: Secondary | ICD-10-CM

## 2021-09-13 ENCOUNTER — Ambulatory Visit
Admission: RE | Admit: 2021-09-13 | Discharge: 2021-09-13 | Disposition: A | Discharge status: Home / Self Care | Payer: BC Managed Care – PPO | Source: Ambulatory Visit | Attending: Gastroenterology | Admitting: Gastroenterology

## 2021-09-13 DIAGNOSIS — K7469 Other cirrhosis of liver: Secondary | ICD-10-CM

## 2022-03-07 ENCOUNTER — Other Ambulatory Visit: Payer: Self-pay | Admitting: Gastroenterology

## 2022-03-07 DIAGNOSIS — K7469 Other cirrhosis of liver: Secondary | ICD-10-CM

## 2022-04-11 ENCOUNTER — Ambulatory Visit
Admission: RE | Admit: 2022-04-11 | Discharge: 2022-04-11 | Disposition: A | Discharge status: Home / Self Care | Payer: BC Managed Care – PPO | Source: Ambulatory Visit | Attending: Gastroenterology | Admitting: Gastroenterology

## 2022-04-11 DIAGNOSIS — K7469 Other cirrhosis of liver: Secondary | ICD-10-CM

## 2022-09-01 ENCOUNTER — Other Ambulatory Visit: Payer: Self-pay | Admitting: Obstetrics and Gynecology

## 2022-09-01 DIAGNOSIS — Z780 Asymptomatic menopausal state: Secondary | ICD-10-CM

## 2022-09-01 DIAGNOSIS — M8580 Other specified disorders of bone density and structure, unspecified site: Secondary | ICD-10-CM

## 2022-09-06 IMAGING — CR DG CHEST 2V
1 series · 2 of 2 positions shown · non-contrast
Comparison: 10/20/2019

CLINICAL DATA: Chest pain.

EXAM:
CHEST - 2 VIEW

[Series 1: dg chest 2 view · 0.14mm/px · 2 of 2 slices shown]
[im 1/2]
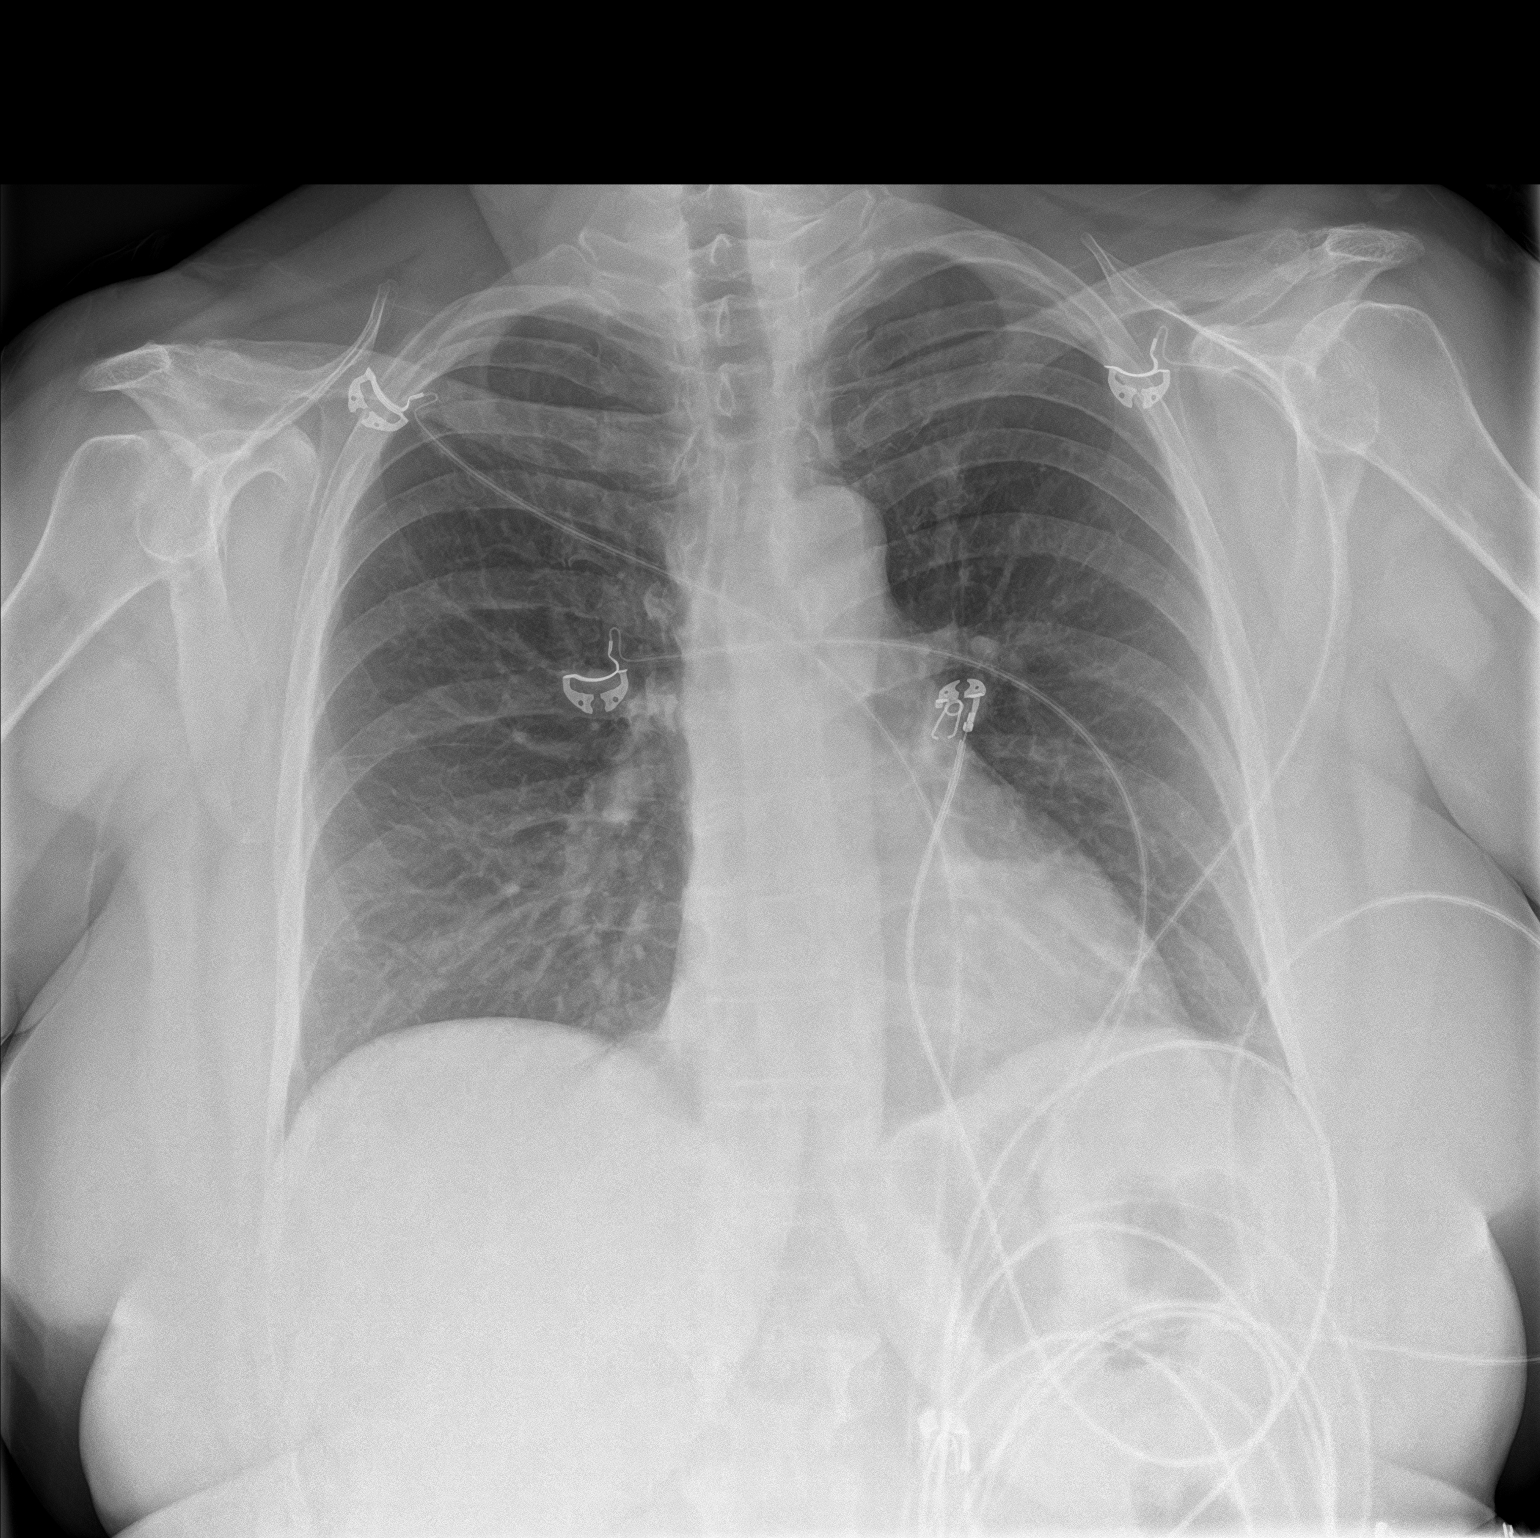
[im 2/2]
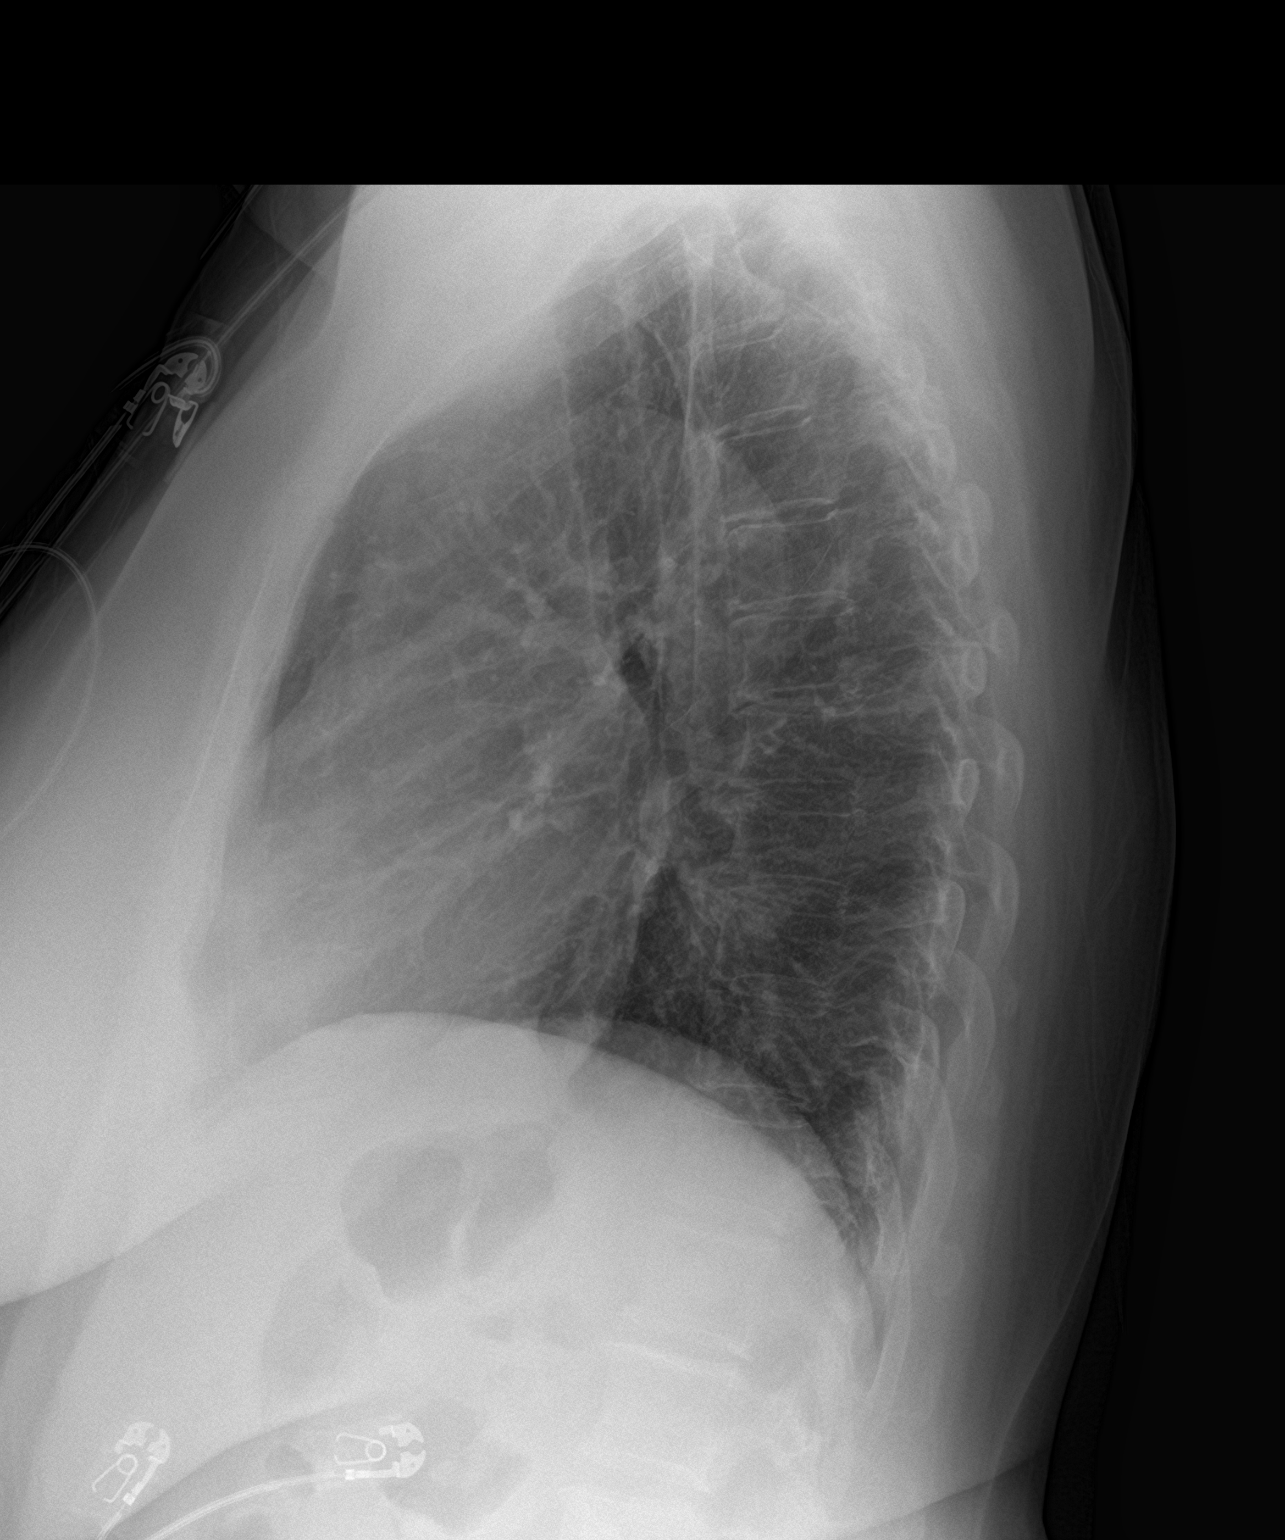

[2 of 2 positions shown; findings below may reference images not displayed]

FINDINGS: The heart size and mediastinal contours are within normal limits.
Both lungs are clear. No visible pleural effusions or pneumothorax.
No acute osseous abnormality. Osteopenia.
IMPRESSION: No active cardiopulmonary disease.

## 2022-10-07 ENCOUNTER — Other Ambulatory Visit: Payer: Self-pay | Admitting: Gastroenterology

## 2022-10-07 DIAGNOSIS — K7469 Other cirrhosis of liver: Secondary | ICD-10-CM

## 2022-10-17 ENCOUNTER — Ambulatory Visit
Admission: RE | Admit: 2022-10-17 | Discharge: 2022-10-17 | Disposition: A | Discharge status: Home / Self Care | Payer: BC Managed Care – PPO | Source: Ambulatory Visit | Attending: Gastroenterology | Admitting: Gastroenterology

## 2022-10-17 DIAGNOSIS — K7469 Other cirrhosis of liver: Secondary | ICD-10-CM

## 2023-03-20 ENCOUNTER — Ambulatory Visit
Admission: RE | Admit: 2023-03-20 | Discharge: 2023-03-20 | Disposition: A | Discharge status: Home / Self Care | Payer: BC Managed Care – PPO | Source: Ambulatory Visit | Attending: Obstetrics and Gynecology | Admitting: Obstetrics and Gynecology

## 2023-03-20 DIAGNOSIS — M8580 Other specified disorders of bone density and structure, unspecified site: Secondary | ICD-10-CM

## 2023-03-20 DIAGNOSIS — Z780 Asymptomatic menopausal state: Secondary | ICD-10-CM

## 2023-04-08 ENCOUNTER — Other Ambulatory Visit: Payer: Self-pay | Admitting: Gastroenterology

## 2023-04-08 DIAGNOSIS — K7469 Other cirrhosis of liver: Secondary | ICD-10-CM

## 2023-04-17 ENCOUNTER — Ambulatory Visit
Admission: RE | Admit: 2023-04-17 | Discharge: 2023-04-17 | Disposition: A | Discharge status: Home / Self Care | Source: Ambulatory Visit | Attending: Gastroenterology | Admitting: Gastroenterology

## 2023-04-17 DIAGNOSIS — K7469 Other cirrhosis of liver: Secondary | ICD-10-CM

## 2023-10-07 ENCOUNTER — Other Ambulatory Visit: Payer: Self-pay | Admitting: Gastroenterology

## 2023-10-07 DIAGNOSIS — K7469 Other cirrhosis of liver: Secondary | ICD-10-CM

## 2023-10-08 ENCOUNTER — Ambulatory Visit
Admission: RE | Admit: 2023-10-08 | Discharge: 2023-10-08 | Disposition: A | Discharge status: Home / Self Care | Source: Ambulatory Visit | Attending: Gastroenterology | Admitting: Gastroenterology

## 2023-10-08 DIAGNOSIS — K7469 Other cirrhosis of liver: Secondary | ICD-10-CM
